# Patient Record
Sex: Male | Born: 1995 | Race: White | Hispanic: No | Marital: Married | Smoking: Never smoker
Health system: Southern US, Community
[De-identification: ages and names within clinical notes are randomized; demographics above are authoritative.]

## PROBLEM LIST (undated history)

## (undated) DIAGNOSIS — R519 Headache, unspecified: Secondary | ICD-10-CM

## (undated) DIAGNOSIS — K219 Gastro-esophageal reflux disease without esophagitis: Secondary | ICD-10-CM

## (undated) HISTORY — PX: ANKLE SURGERY: SHX546

## (undated) HISTORY — DX: Headache, unspecified: R51.9

## (undated) HISTORY — PX: FRACTURE SURGERY: SHX138

## (undated) HISTORY — DX: Gastro-esophageal reflux disease without esophagitis: K21.9

## (undated) HISTORY — PX: WRIST SURGERY: SHX841

---
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
## 2017-05-07 ENCOUNTER — Other Ambulatory Visit: Payer: Self-pay

## 2017-05-07 ENCOUNTER — Ambulatory Visit
Admission: EM | Admit: 2017-05-07 | Discharge: 2017-05-07 | Disposition: A | Discharge status: Home / Self Care | Payer: BLUE CROSS/BLUE SHIELD | Attending: Family Medicine | Admitting: Family Medicine

## 2017-05-07 DIAGNOSIS — L237 Allergic contact dermatitis due to plants, except food: Secondary | ICD-10-CM

## 2017-05-07 MED ORDER — PREDNISONE 20 MG PO TABS: ORAL_TABLET | ORAL | 0 refills | Status: DC

## 2017-05-07 NOTE — ED Provider Notes (Signed)
[facility]    CSN: [id number] Arrival date & time: 05/07/17  1350     History   Chief Complaint Chief Complaint  Patient presents with  . Poison Ivy    HPI Theodore Greer is a 22 y.o. male.   22 yo male with a 2 day h/o itchy rash to arms and hands after exposure to poison ivy earlier this week and denies wheezing, shortness of breath, chest pain.   The history is provided by the patient.  Poison Ivy     History reviewed. No pertinent past medical history.  There are no active problems to display for this patient.   Past Surgical History:  Procedure Laterality Date  . WRIST SURGERY         Home Medications    Prior to Admission medications   Medication Sig Start Date End Date Taking? Authorizing Provider  predniSONE (DELTASONE) 20 MG tablet 2 tabs po qd x 7 days 05/07/17   Payton Mccallumonty, Cara Aguino, MD    Family History History reviewed. No pertinent family history.  Social History Social History   Tobacco Use  . Smoking status: Never Smoker  . Smokeless tobacco: Never Used  Substance Use Topics  . Alcohol use: Not on file  . Drug use: Not on file     Allergies   Patient has no known allergies.   Review of Systems Review of Systems   Physical Exam Triage Vital Signs ED Triage Vitals  Enc Vitals Group     BP 05/07/17 1410 136/77     Pulse Rate 05/07/17 1410 98     Resp 05/07/17 1410 16     Temp 05/07/17 1410 98.4 F (36.9 C)     Temp Source 05/07/17 1410 Oral     SpO2 05/07/17 1410 100 %     Weight 05/07/17 1408 157 lb (71.2 kg)     Height 05/07/17 1408 5\' 8"  (1.727 m)     Head Circumference --      Peak Flow --      Pain Score 05/07/17 1408 0     Pain Loc --      Pain Edu? --      Excl. in GC? --    No data found.  Updated Vital Signs BP 136/77 (BP Location: Left Arm)   Pulse 98   Temp 98.4 F (36.9 C) (Oral)   Resp 16   Ht 5\' 8"  (1.727 m)   Wt 157 lb (71.2 kg)   SpO2 100%   BMI 23.87 kg/m   Visual Acuity Right  Eye Distance:   Left Eye Distance:   Bilateral Distance:    Right Eye Near:   Left Eye Near:    Bilateral Near:     Physical Exam  Constitutional: He appears well-developed and well-nourished. No distress.  Skin: Rash noted. Rash is vesicular. He is not diaphoretic. There is erythema.     Nursing note and vitals reviewed.    UC Treatments / Results  Labs (all labs ordered are listed, but only abnormal results are displayed) Labs Reviewed - No data to display  EKG None Radiology No results found.  Procedures Procedures (including critical care time)  Medications Ordered in UC Medications - No data to display   Initial Impression / Assessment and Plan / UC Course  I have reviewed the triage vital signs and the nursing notes.  Pertinent labs & imaging results that were available during my care of the patient were reviewed by me  and considered in my medical decision making (see chart for details).      Final Clinical Impressions(s) / UC Diagnoses   Final diagnoses:  Contact dermatitis due to poison ivy    ED Discharge Orders        Ordered    predniSONE (DELTASONE) 20 MG tablet     05/07/17 1438     1. diagnosis reviewed with patient 2. rx as per orders above; reviewed possible side effects, interactions, risks and benefits  3. Recommend supportive treatment with  4. Follow-up prn if symptoms worsen or don't improve   Controlled Substance Prescriptions [place] Controlled Substance Registry consulted? Not Applicable   Payton Mccallum, MD 05/07/17 [phone]

## 2017-05-07 NOTE — ED Triage Notes (Signed)
Pt reports he got into poison ivy. Mostly on arms and face

## 2017-09-04 ENCOUNTER — Ambulatory Visit
Admission: EM | Admit: 2017-09-04 | Discharge: 2017-09-04 | Disposition: A | Discharge status: Home / Self Care | Payer: Worker's Compensation

## 2019-12-10 ENCOUNTER — Emergency Department
Admission: EM | Admit: 2019-12-10 | Discharge: 2019-12-10 | Disposition: A | Discharge status: Home / Self Care | Payer: BC Managed Care – PPO | Attending: Emergency Medicine | Admitting: Emergency Medicine

## 2019-12-10 ENCOUNTER — Other Ambulatory Visit: Payer: Self-pay

## 2019-12-10 ENCOUNTER — Emergency Department: Payer: BC Managed Care – PPO

## 2019-12-10 DIAGNOSIS — R079 Chest pain, unspecified: Secondary | ICD-10-CM | POA: Diagnosis present

## 2019-12-10 DIAGNOSIS — M940 Chondrocostal junction syndrome [Tietze]: Secondary | ICD-10-CM | POA: Diagnosis not present

## 2019-12-10 DIAGNOSIS — F419 Anxiety disorder, unspecified: Secondary | ICD-10-CM | POA: Insufficient documentation

## 2019-12-10 DIAGNOSIS — R000 Tachycardia, unspecified: Secondary | ICD-10-CM | POA: Insufficient documentation

## 2019-12-10 LAB — CBC WITH DIFFERENTIAL/PLATELET
Abs Immature Granulocytes: 0.04 10*3/uL (ref 0.00–0.07)
Basophils Absolute: 0.1 10*3/uL (ref 0.0–0.1)
Basophils Relative: 1 %
Eosinophils Absolute: 0.2 10*3/uL (ref 0.0–0.5)
Eosinophils Relative: 2 %
HCT: 44.5 % (ref 39.0–52.0)
Hemoglobin: 15.7 g/dL (ref 13.0–17.0)
Immature Granulocytes: 0 %
Lymphocytes Relative: 40 %
Lymphs Abs: 4.3 10*3/uL — ABNORMAL HIGH (ref 0.7–4.0)
MCH: 30.7 pg (ref 26.0–34.0)
MCHC: 35.3 g/dL (ref 30.0–36.0)
MCV: 86.9 fL (ref 80.0–100.0)
Monocytes Absolute: 0.7 10*3/uL (ref 0.1–1.0)
Monocytes Relative: 6 %
Neutro Abs: 5.4 10*3/uL (ref 1.7–7.7)
Neutrophils Relative %: 51 %
Platelets: 389 10*3/uL (ref 150–400)
RBC: 5.12 MIL/uL (ref 4.22–5.81)
RDW: 11.7 % (ref 11.5–15.5)
WBC: 10.7 10*3/uL — ABNORMAL HIGH (ref 4.0–10.5)
nRBC: 0 % (ref 0.0–0.2)

## 2019-12-10 LAB — COMPREHENSIVE METABOLIC PANEL
ALT: 21 U/L (ref 0–44)
AST: 23 U/L (ref 15–41)
Albumin: 5.1 g/dL — ABNORMAL HIGH (ref 3.5–5.0)
Alkaline Phosphatase: 67 U/L (ref 38–126)
Anion gap: 11 (ref 5–15)
BUN: 18 mg/dL (ref 6–20)
CO2: 23 mmol/L (ref 22–32)
Calcium: 9.4 mg/dL (ref 8.9–10.3)
Chloride: 105 mmol/L (ref 98–111)
Creatinine, Ser: 0.99 mg/dL (ref 0.61–1.24)
GFR, Estimated: >60 mL/min (ref >60)
Glucose, Bld: 117 mg/dL — ABNORMAL HIGH (ref 70–99)
Potassium: 3.7 mmol/L (ref 3.5–5.1)
Sodium: 139 mmol/L (ref 135–145)
Total Bilirubin: 0.9 mg/dL (ref 0.3–1.2)
Total Protein: 8.1 g/dL (ref 6.5–8.1)

## 2019-12-10 LAB — T4, FREE: Free T4: 0.82 ng/dL (ref 0.61–1.12)

## 2019-12-10 LAB — TSH: TSH: 1.759 u[IU]/mL (ref 0.350–4.500)

## 2019-12-10 LAB — TROPONIN I (HIGH SENSITIVITY): Troponin I (High Sensitivity): 3 ng/L (ref <18)

## 2019-12-10 LAB — FIBRIN DERIVATIVES D-DIMER (ARMC ONLY): Fibrin derivatives D-dimer (ARMC): 119.41 ng/mL (FEU) (ref 0.00–499.00)

## 2019-12-10 MED ORDER — KETOROLAC TROMETHAMINE 30 MG/ML IJ SOLN
10.0000 mg | Freq: Once | INTRAMUSCULAR | Status: AC
  Administered 2019-12-10: 9.9 mg via INTRAVENOUS
  Filled 2019-12-10: qty 1

## 2019-12-10 MED ORDER — SODIUM CHLORIDE 0.9 % IV BOLUS
1000.0000 mL | Freq: Once | INTRAVENOUS | Status: AC
  Administered 2019-12-10: 1000 mL via INTRAVENOUS

## 2019-12-10 MED ORDER — HYDROCODONE-ACETAMINOPHEN 5-325 MG PO TABS: 1.0000 | ORAL_TABLET | Freq: Four times a day (QID) | ORAL | 0 refills | Status: DC | PRN

## 2019-12-10 MED ORDER — IBUPROFEN 800 MG PO TABS: 800.0000 mg | ORAL_TABLET | Freq: Three times a day (TID) | ORAL | 0 refills | Status: DC | PRN

## 2019-12-10 NOTE — Discharge Instructions (Addendum)
1.  You may take pain medicines as needed (Motrin/Norco #15). 2.  Apply moist heat to affected area several times daily. 3.  Return to the ER for worsening symptoms, persistent vomiting, difficulty breathing or other concerns. 

## 2019-12-10 NOTE — ED Triage Notes (Signed)
Patient reports chest pain since 5 pm.  Worse with movement.

## 2019-12-10 NOTE — ED Provider Notes (Signed)
[facility] Emergency Department Provider Note   ____________________________________________   First MD Initiated Contact with Patient 12/10/19 0047     (approximate)  I have reviewed the triage vital signs and the nursing notes.   HISTORY  Chief Complaint Chest Pain    HPI Theodore Greer is a 24 y.o. male who presents to the ED from home with a chief complaint of chest pain.  Patient was at a paint and sip party (did not drink EtOH) when he noted sharp pain to his center/left chest on movement.  Symptoms not associated with diaphoresis, palpitations, nausea/vomiting or dizziness.  Denies recent fever, cough, abdominal pain, diarrhea.  Denies recent travel, trauma or hormone use.     Past medical history None   There are no problems to display for this patient.   Past Surgical History:  Procedure Laterality Date  . WRIST SURGERY      Prior to Admission medications   Medication Sig Start Date End Date Taking? Authorizing Provider  HYDROcodone-acetaminophen (NORCO) 5-325 MG tablet Take 1 tablet by mouth every 6 (six) hours as needed for moderate pain. 12/10/19   Irean Hong, MD  ibuprofen (ADVIL) 800 MG tablet Take 1 tablet (800 mg total) by mouth every 8 (eight) hours as needed for moderate pain. 12/10/19   Irean Hong, MD  predniSONE (DELTASONE) 20 MG tablet 2 tabs po qd x 7 days 05/07/17   Payton Mccallum, MD    Allergies Patient has no known allergies.  No family history on file.  Social History Social History   Tobacco Use  . Smoking status: Never Smoker  . Smokeless tobacco: Never Used  Substance Use Topics  . Alcohol use: Not on file  . Drug use: Not on file    Review of Systems  Constitutional: No fever/chills Eyes: No visual changes. ENT: No sore throat. Cardiovascular: Positive for chest pain. Respiratory: Denies shortness of breath. Gastrointestinal: No abdominal pain.  No nausea, no vomiting.  No diarrhea.  No  constipation. Genitourinary: Negative for dysuria. Musculoskeletal: Negative for back pain. Skin: Negative for rash. Neurological: Negative for headaches, focal weakness or numbness.   ____________________________________________   PHYSICAL EXAM:  VITAL SIGNS: ED Triage Vitals  Enc Vitals Group     BP 12/10/19 0044 (!) 154/95     Pulse Rate 12/10/19 0044 (!) 110     Resp 12/10/19 0044 17     Temp 12/10/19 0044 98.9 F (37.2 C)     Temp Source 12/10/19 0044 Oral     SpO2 12/10/19 0044 100 %     Weight 12/10/19 0042 185 lb (83.9 kg)     Height 12/10/19 0042 5\' 8"  (1.727 m)     Head Circumference --      Peak Flow --      Pain Score 12/10/19 0042 3     Pain Loc --      Pain Edu? --      Excl. in GC? --     Constitutional: Alert and oriented. Well appearing and in no acute distress. Eyes: Conjunctivae are normal. PERRL. EOMI. Head: Atraumatic. Nose: No congestion/rhinnorhea. Mouth/Throat: Mucous membranes are moist.   Neck: No stridor.  No thyromegaly. Cardiovascular: Tachycardic rate, regular rhythm. Grossly normal heart sounds.  Good peripheral circulation. Respiratory: Normal respiratory effort.  No retractions. Lungs CTAB.  Reproducible pain on movement of trunk. Gastrointestinal: Soft and nontender. No distention. No abdominal bruits. No CVA tenderness. Musculoskeletal: No lower extremity tenderness nor edema.  No joint effusions. Neurologic:  Normal speech and language. No gross focal neurologic deficits are appreciated. No gait instability. Skin:  Skin is warm, dry and intact. No rash noted. Psychiatric: Mood and affect are mildly anxious. Speech and behavior are normal.  ____________________________________________   LABS (all labs ordered are listed, but only abnormal results are displayed)  Labs Reviewed  CBC WITH DIFFERENTIAL/PLATELET - Abnormal; Notable for the following components:      Result Value   WBC 10.7 (*)    Lymphs Abs 4.3 (*)    All other  components within normal limits  COMPREHENSIVE METABOLIC PANEL - Abnormal; Notable for the following components:   Glucose, Bld 117 (*)    Albumin 5.1 (*)    All other components within normal limits  FIBRIN DERIVATIVES D-DIMER ([facility] ONLY)  TSH  T4, FREE  TROPONIN I (HIGH SENSITIVITY)   ____________________________________________  EKG  ED ECG REPORT I, Elnita Surprenant J, the attending physician, personally viewed and interpreted this ECG.   Date: 12/10/2019  EKG Time: 0046  Rate: 121  Rhythm: sinus tachycardia  Axis: Normal  Intervals:none  ST&T Change: Nonspecific  ____________________________________________  RADIOLOGY I, Johanna Matto J, personally viewed and evaluated these images (plain radiographs) as part of my medical decision making, as well as reviewing the written report by the radiologist.  ED MD interpretation: No acute cardiopulmonary process  Official radiology report(s): DG Chest 2 View  Result Date: 12/10/2019 CLINICAL DATA:  Chest pain, worse with movement EXAM: CHEST - 2 VIEW COMPARISON:  None FINDINGS: No consolidation, features of edema, pneumothorax, or effusion. Pulmonary vascularity is normally distributed. The cardiomediastinal contours are unremarkable. No acute osseous or soft tissue abnormality. IMPRESSION: No acute cardiopulmonary abnormality. Electronically Signed   By: Kreg Shropshire M.D.   On: 12/10/2019 01:21    ____________________________________________   PROCEDURES  Procedure(s) performed (including Critical Care):  .1-3 Lead EKG Interpretation Performed by: Irean Hong, MD Authorized by: Irean Hong, MD     Interpretation: abnormal     ECG rate:  121   ECG rate assessment: tachycardic     Rhythm: sinus tachycardia     Ectopy: none     Conduction: normal   Comments:     Patient placed on cardiac monitor to evaluate for arrhythmias     ____________________________________________   INITIAL IMPRESSION / ASSESSMENT AND PLAN /  ED COURSE  As part of my medical decision making, I reviewed the following data within the electronic MEDICAL RECORD NUMBER Nursing notes reviewed and incorporated, Labs reviewed, EKG interpreted, Old chart reviewed, Radiograph reviewed and Notes from prior ED visits     24 year old male who presents with chest pain. Differential diagnosis includes, but is not limited to, ACS, aortic dissection, pulmonary embolism, cardiac tamponade, pneumothorax, pneumonia, pericarditis, myocarditis, GI-related causes including esophagitis/gastritis, and musculoskeletal chest wall pain.    Will obtain cardiac panel, chest x-ray.  Patient attributing his tachycardia to anxiety/nervousness.  Denies EtOH or drug use.  Drinks 1 soda daily, no energy drinks or herbal supplements.  Will administer IV fluids, Toradol and reassess.  Clinical Course as of Dec 09 198  Wynelle Link Dec 10, 2019  0157 Patient feeling significantly better; heart rate 83.  Updated patient on all test results.  Do not feel repeat troponin is warranted given that troponin is negative after >8 hours of chest pain onset.  Strict return precautions given.  Patient verbalizes understanding agrees with plan of care.   [JS]    Clinical Course User  Index [JS] Irean Hong, MD     ____________________________________________   FINAL CLINICAL IMPRESSION(S) / ED DIAGNOSES  Final diagnoses:  Nonspecific chest pain  Costochondritis     ED Discharge Orders         Ordered    ibuprofen (ADVIL) 800 MG tablet  Every 8 hours PRN        12/10/19 0159    HYDROcodone-acetaminophen (NORCO) 5-325 MG tablet  Every 6 hours PRN        12/10/19 0159          *Please note:  Theodore Greer was evaluated in Emergency Department on 12/10/2019 for the symptoms described in the history of present illness. He was evaluated in the context of the global COVID-19 pandemic, which necessitated consideration that the patient might be at risk for infection with the  SARS-CoV-2 virus that causes COVID-19. Institutional protocols and algorithms that pertain to the evaluation of patients at risk for COVID-19 are in a state of rapid change based on information released by regulatory bodies including the CDC and federal and state organizations. These policies and algorithms were followed during the patient's care in the ED.  Some ED evaluations and interventions may be delayed as a result of limited staffing during and the pandemic.*   Note:  This document was prepared using Dragon voice recognition software and may include unintentional dictation errors.   Irean Hong, MD 12/10/19 [phone]

## 2020-10-25 DIAGNOSIS — R000 Tachycardia, unspecified: Secondary | ICD-10-CM | POA: Diagnosis not present

## 2020-10-25 DIAGNOSIS — Z6828 Body mass index (BMI) 28.0-28.9, adult: Secondary | ICD-10-CM | POA: Diagnosis not present

## 2020-10-25 DIAGNOSIS — Z5329 Procedure and treatment not carried out because of patient's decision for other reasons: Secondary | ICD-10-CM | POA: Diagnosis not present

## 2020-10-25 DIAGNOSIS — R1013 Epigastric pain: Secondary | ICD-10-CM | POA: Diagnosis not present

## 2020-10-27 DIAGNOSIS — R000 Tachycardia, unspecified: Secondary | ICD-10-CM | POA: Diagnosis not present

## 2021-04-22 DIAGNOSIS — M25572 Pain in left ankle and joints of left foot: Secondary | ICD-10-CM | POA: Diagnosis not present

## 2021-04-22 DIAGNOSIS — X501XXA Overexertion from prolonged static or awkward postures, initial encounter: Secondary | ICD-10-CM | POA: Diagnosis not present

## 2021-04-22 DIAGNOSIS — Y9364 Activity, baseball: Secondary | ICD-10-CM | POA: Diagnosis not present

## 2021-04-22 DIAGNOSIS — S82899A Other fracture of unspecified lower leg, initial encounter for closed fracture: Secondary | ICD-10-CM | POA: Diagnosis not present

## 2021-04-22 DIAGNOSIS — S92112A Displaced fracture of neck of left talus, initial encounter for closed fracture: Secondary | ICD-10-CM | POA: Diagnosis not present

## 2021-04-22 DIAGNOSIS — S92142A Displaced dome fracture of left talus, initial encounter for closed fracture: Secondary | ICD-10-CM | POA: Diagnosis not present

## 2021-04-22 DIAGNOSIS — R509 Fever, unspecified: Secondary | ICD-10-CM | POA: Diagnosis not present

## 2021-04-22 DIAGNOSIS — S92122A Displaced fracture of body of left talus, initial encounter for closed fracture: Secondary | ICD-10-CM | POA: Diagnosis not present

## 2021-04-22 DIAGNOSIS — S92102A Unspecified fracture of left talus, initial encounter for closed fracture: Secondary | ICD-10-CM | POA: Diagnosis not present

## 2021-04-23 DIAGNOSIS — S92142A Displaced dome fracture of left talus, initial encounter for closed fracture: Secondary | ICD-10-CM | POA: Diagnosis not present

## 2021-04-23 DIAGNOSIS — S92102A Unspecified fracture of left talus, initial encounter for closed fracture: Secondary | ICD-10-CM | POA: Diagnosis not present

## 2021-04-23 DIAGNOSIS — S92122A Displaced fracture of body of left talus, initial encounter for closed fracture: Secondary | ICD-10-CM | POA: Diagnosis not present

## 2021-04-24 DIAGNOSIS — S92112A Displaced fracture of neck of left talus, initial encounter for closed fracture: Secondary | ICD-10-CM | POA: Insufficient documentation

## 2021-04-30 DIAGNOSIS — F909 Attention-deficit hyperactivity disorder, unspecified type: Secondary | ICD-10-CM | POA: Diagnosis not present

## 2021-04-30 DIAGNOSIS — Z6830 Body mass index (BMI) 30.0-30.9, adult: Secondary | ICD-10-CM | POA: Diagnosis not present

## 2021-04-30 DIAGNOSIS — M24072 Loose body in left ankle: Secondary | ICD-10-CM | POA: Diagnosis not present

## 2021-04-30 DIAGNOSIS — Y9364 Activity, baseball: Secondary | ICD-10-CM | POA: Diagnosis not present

## 2021-04-30 DIAGNOSIS — S92112A Displaced fracture of neck of left talus, initial encounter for closed fracture: Secondary | ICD-10-CM | POA: Diagnosis not present

## 2021-04-30 DIAGNOSIS — E669 Obesity, unspecified: Secondary | ICD-10-CM | POA: Diagnosis not present

## 2021-04-30 DIAGNOSIS — Z79899 Other long term (current) drug therapy: Secondary | ICD-10-CM | POA: Diagnosis not present

## 2021-04-30 DIAGNOSIS — G8918 Other acute postprocedural pain: Secondary | ICD-10-CM | POA: Diagnosis not present

## 2021-04-30 DIAGNOSIS — W19XXXA Unspecified fall, initial encounter: Secondary | ICD-10-CM | POA: Diagnosis not present

## 2021-04-30 DIAGNOSIS — M25572 Pain in left ankle and joints of left foot: Secondary | ICD-10-CM | POA: Diagnosis not present

## 2021-05-12 DIAGNOSIS — S92102D Unspecified fracture of left talus, subsequent encounter for fracture with routine healing: Secondary | ICD-10-CM | POA: Diagnosis not present

## 2021-06-02 DIAGNOSIS — S92102A Unspecified fracture of left talus, initial encounter for closed fracture: Secondary | ICD-10-CM | POA: Diagnosis not present

## 2021-07-14 DIAGNOSIS — S92102D Unspecified fracture of left talus, subsequent encounter for fracture with routine healing: Secondary | ICD-10-CM | POA: Diagnosis not present

## 2021-09-09 DIAGNOSIS — S92102D Unspecified fracture of left talus, subsequent encounter for fracture with routine healing: Secondary | ICD-10-CM | POA: Diagnosis not present

## 2021-09-09 DIAGNOSIS — S92102A Unspecified fracture of left talus, initial encounter for closed fracture: Secondary | ICD-10-CM | POA: Diagnosis not present

## 2021-09-16 ENCOUNTER — Encounter: Payer: Self-pay | Admitting: Family

## 2021-09-16 NOTE — Telephone Encounter (Signed)
Can we get pt in for 40 minute slot next availability for a double booking? Can be as early as this week.

## 2021-10-14 ENCOUNTER — Other Ambulatory Visit: Payer: Self-pay | Admitting: Family

## 2021-10-14 ENCOUNTER — Encounter: Payer: Self-pay | Admitting: Family

## 2021-10-14 ENCOUNTER — Ambulatory Visit (INDEPENDENT_AMBULATORY_CARE_PROVIDER_SITE_OTHER): Payer: BC Managed Care – PPO | Admitting: Family

## 2021-10-14 VITALS — BP 110/72 | HR 97 | Temp 97.9°F | Resp 16 | Ht 67.25 in | Wt 206.4 lb

## 2021-10-14 DIAGNOSIS — K921 Melena: Secondary | ICD-10-CM | POA: Diagnosis not present

## 2021-10-14 DIAGNOSIS — Z23 Encounter for immunization: Secondary | ICD-10-CM

## 2021-10-14 DIAGNOSIS — R12 Heartburn: Secondary | ICD-10-CM | POA: Diagnosis not present

## 2021-10-14 DIAGNOSIS — G44209 Tension-type headache, unspecified, not intractable: Secondary | ICD-10-CM | POA: Diagnosis not present

## 2021-10-14 DIAGNOSIS — F902 Attention-deficit hyperactivity disorder, combined type: Secondary | ICD-10-CM | POA: Diagnosis not present

## 2021-10-14 DIAGNOSIS — Z13220 Encounter for screening for lipoid disorders: Secondary | ICD-10-CM | POA: Diagnosis not present

## 2021-10-14 DIAGNOSIS — R10821 Right upper quadrant rebound abdominal tenderness: Secondary | ICD-10-CM

## 2021-10-14 DIAGNOSIS — R0789 Other chest pain: Secondary | ICD-10-CM

## 2021-10-14 MED ORDER — OMEPRAZOLE 20 MG PO CPDR: 20.0000 mg | DELAYED_RELEASE_CAPSULE | Freq: Every day | ORAL | 3 refills | Status: DC

## 2021-10-14 NOTE — Progress Notes (Signed)
New Patient Office Visit  Subjective:  Patient ID: Theodore Greer, male    DOB: [birth year]  Age: 26 y.o. MRN: [id number]  CC:  Chief Complaint  Patient presents with   Establish Care   Gastroesophageal Reflux   Headache    Eye doctor said it was coming from needing glasses but they have not came in yet.    HPI Vue Pavon is here to establish care as a new patient.  Prior provider was: since pediatrics  Pt is without acute concerns.   Has been having worsening headaches, steady, and went and eye doctor and was dx with astigmatism and eye doctor stated might be due to this. Eyes are straining and slightly with frontal headaches. Denies any allergies.  Taking tylenol ibuprofen prn.  Drinks good amount of water daily.   Also with acid reflux. He will feel acid indigestion, and some pains or aches around his generalized chest area. Notices it after eating certain foods such as greasy fried fatty foods. Has been over the last two years. Burps at times but not overly burping. No nausea. Doesn't notice any specific concern with red meats.   Occasional blood at times in the stool does have constipation.   chronic concerns:  ADHD as a child: controlled now without medication.  Was on concerta as a child and ritalin, no longer. Able to manage now without medication.   Past Medical History:  Diagnosis Date   Acid reflux    Headache     Past Surgical History:  Procedure Laterality Date   ANKLE SURGERY Left    WRIST SURGERY Left    bone spur growth plate removed    Family History  Problem Relation Age of Onset   Healthy Mother    Healthy Father    Brain cancer Maternal Grandfather     Social History   Socioeconomic History   Marital status: Married    Spouse name: Not on file   Number of children: 0   Years of education: Not on file   Highest education level: Not on file  Occupational History    Comment: [employer]  Tobacco Use   Smoking status: Never    Smokeless tobacco: Former  Scientific laboratory technician Use: Former  Substance and Sexual Activity   Alcohol use: Yes    Comment: occ   Drug use: Not Currently    Types: Marijuana   Sexual activity: Yes    Partners: Female    Birth control/protection: None    Comment: wife is pregnant  Other Topics Concern   Not on file  Social History Narrative   Wife pregnant due in feb   Social Determinants of Health   Financial Resource Strain: Not on file  Food Insecurity: Not on file  Transportation Needs: Not on file  Physical Activity: Not on file  Stress: Not on file  Social Connections: Not on file  Intimate Partner Violence: Not on file    Outpatient Medications Prior to Visit  Medication Sig Dispense Refill   HYDROcodone-acetaminophen (NORCO) 5-325 MG tablet Take 1 tablet by mouth every 6 (six) hours as needed for moderate pain. (Patient not taking: Reported on 10/14/2021) 15 tablet 0   ibuprofen (ADVIL) 800 MG tablet Take 1 tablet (800 mg total) by mouth every 8 (eight) hours as needed for moderate pain. (Patient not taking: Reported on 10/14/2021) 15 tablet 0   predniSONE (DELTASONE) 20 MG tablet 2 tabs po qd x 7 days (Patient not  taking: Reported on 10/14/2021) 14 tablet 0   No facility-administered medications prior to visit.    No Known Allergies  ROS Review of Systems      Objective:    Physical Exam Constitutional:      General: He is not in acute distress.    Appearance: Normal appearance. He is obese. He is not ill-appearing, toxic-appearing or diaphoretic.  HENT:     Head: Normocephalic.     Right Ear: Tympanic membrane normal.     Left Ear: Tympanic membrane normal.     Nose: Nose normal.  Eyes:     Pupils: Pupils are equal, round, and reactive to light.  Cardiovascular:     Rate and Rhythm: Normal rate and regular rhythm.  Pulmonary:     Effort: Pulmonary effort is normal.     Breath sounds: Normal breath sounds.  Abdominal:     General: Abdomen is flat.  Bowel sounds are normal.     Palpations: Abdomen is soft.     Tenderness: There is abdominal tenderness in the right upper quadrant and epigastric area. Negative signs include Murphy's sign, McBurney's sign, psoas sign and obturator sign.  Musculoskeletal:        General: Normal range of motion.     Cervical back: Normal range of motion.  Skin:    General: Skin is warm.  Neurological:     General: No focal deficit present.     Mental Status: He is alert and oriented to person, place, and time.     Motor: No weakness.     Gait: Gait normal.  Psychiatric:        Mood and Affect: Mood normal.        Behavior: Behavior normal.        Thought Content: Thought content normal.        Judgment: Judgment normal.     Gen: NAD, resting comfortably CV: RRR with no murmurs appreciated Pulm: NWOB, CTAB with no crackles, wheezes, or rhonchi Skin: warm, dry Psych: Normal affect and thought content  BP 110/72   Pulse 97   Temp 97.9 F (36.6 C)   Resp 16   Ht 5' 7.25" (1.708 m)   Wt 206 lb 6 oz (93.6 kg)   SpO2 98%   BMI 32.08 kg/m  Wt Readings from Last 3 Encounters:  10/14/21 206 lb 6 oz (93.6 kg)  12/10/19 185 lb (83.9 kg)  05/07/17 157 lb (71.2 kg) (53 %, Z= 0.07)*   * Growth percentiles are based on CDC (Boys, 2-20 Years) data.     Health Maintenance Due  Topic Date Due   HPV VACCINES (1 - Male 2-dose series) Never done   HIV Screening  Never done   Hepatitis C Screening  Never done   INFLUENZA VACCINE  Never done       Topic Date Due   HPV VACCINES (1 - Male 2-dose series) Never done    Lab Results  Component Value Date   TSH 0.73 10/14/2021   Lab Results  Component Value Date   WBC 7.5 10/14/2021   HGB 15.1 10/14/2021   HCT 44.1 10/14/2021   MCV 89.6 10/14/2021   PLT 388.0 10/14/2021   Lab Results  Component Value Date   NA 137 10/14/2021   K 4.0 10/14/2021   CO2 26 10/14/2021   GLUCOSE 89 10/14/2021   BUN 13 10/14/2021   CREATININE 0.94 10/14/2021    BILITOT 0.4 10/14/2021   ALKPHOS 67 10/14/2021  AST 112 (H) 10/14/2021   ALT 119 (H) 10/14/2021   PROT 7.3 10/14/2021   ALBUMIN 4.7 10/14/2021   CALCIUM 9.5 10/14/2021   ANIONGAP 11 12/10/2019   GFR 113.68 10/14/2021   Lab Results  Component Value Date   CHOL 159 10/14/2021   Lab Results  Component Value Date   HDL 46.90 10/14/2021   Lab Results  Component Value Date   LDLCALC 99 10/14/2021   Lab Results  Component Value Date   TRIG 65.0 10/14/2021   Lab Results  Component Value Date   CHOLHDL 3 10/14/2021   No results found for: "HGBA1C"    Assessment & Plan:   Problem List Items Addressed This Visit       Other   Attention deficit hyperactivity disorder (ADHD), combined type - Primary    Controlled currently without medication.       Tension-type headache, not intractable    Pt advised of following MIGRAINE RECOMMENDATIONS TO CONSIDER  1. Limit use of pain relievers to no more than 2 days out of the week.  These medications include acetaminophen, NSAIDs (ibuprofen/Advil/Motrin, naproxen/Aleve, triptans (Imitrex/sumatriptan), Excedrin, and narcotics.  This will help reduce risk of rebound headaches. 2. Be aware of common food triggers:             - Caffeine:  coffee, black tea, cola, Mt. Dew             - Chocolate             - Dairy:  aged cheeses (brie, blue, cheddar, gouda, Craig, provolone, Corinne, Swiss, etc), chocolate milk, buttermilk, sour cream, limit eggs and yogurt             - Nuts, peanut butter             - Alcohol             - Cereals/grains:  FRESH breads (fresh bagels, sourdough, doughnuts), yeast productions             - Processed/canned/aged/cured meats (pre-packaged deli meats, hotdogs)             - MSG/glutamate:  soy sauce, flavor enhancer, pickled/preserved/marinated foods             - Sweeteners:  aspartame (Equal, Nutrasweet).  Sugar and Splenda are okay             - Vegetables:  legumes (lima beans, lentils, snow  peas, fava beans, pinto peans, peas, garbanzo beans), sauerkraut, onions, olives, pickles             - Fruit:  avocados, bananas, citrus fruit (orange, lemon, grapefruit), mango             - Other:  Frozen meals, macaroni and cheese 7. Routine exercise 8. Stay adequately hydrated (aim for 64 oz water daily) 9. Keep headache diary 10. Maintain proper stress management 11. Maintain proper sleep hygiene 12. Do not skip meals 13. Consider supplements:  magnesium citrate 400mg  daily, riboflavin 400mg  daily, coenzyme Q10 100mg  three times daily.        Relevant Orders   TSH (Completed)   Sedimentation rate (Completed)   Blood in stool    Fob ordered pending results ordering cbc as well       Relevant Orders   Fecal occult blood, imunochemical   Heartburn    Trial omeprazole 20 mg  Try to decrease and or avoid spicy foods, fried fatty foods, and also caffeine and chocolate  as these can increase heartburn symptoms.        Relevant Orders   EKG 12-Lead (Completed)   Right upper quadrant abdominal tenderness with rebound tenderness    Ordering lab work pending results Consider u/s if trial ppi and lab work unrevealing      Chest discomfort    ekg in office nsr       Relevant Orders   EKG 12-Lead (Completed)   Comprehensive metabolic panel (Completed)   CBC with Differential/Platelet (Completed)   TSH (Completed)   Other Visit Diagnoses     Screening for lipoid disorders       Relevant Orders   Lipid panel (Completed)   Need for Tdap vaccination       Relevant Orders   Tdap vaccine greater than or equal to 7yo IM (Completed)       Meds ordered this encounter  Medications   DISCONTD: omeprazole (PRILOSEC) 20 MG capsule    Sig: Take 1 capsule (20 mg total) by mouth daily.    Dispense:  30 capsule    Refill:  3    Order Specific Question:   Supervising Provider    Answer:   Ermalene Searing, AMY E [[id number]]    Follow-up: Return in about 1 month (around 11/13/2021) for f/u  acid reflux.    Mort Sawyers, FNP

## 2021-10-14 NOTE — Patient Instructions (Addendum)
Welcome to our clinic, I am happy to have you as my new patient. I am excited to continue on this healthcare journey with you.  Try to decrease and or avoid spicy foods, fried fatty foods, and also caffeine and chocolate as these can increase heartburn symptoms.   Stop by the lab prior to leaving today. I will notify you of your results once received.   Please keep in mind Any my chart messages you send have up to a three business day turnaround for a response.  Phone calls may take up to a one full business day turnaround for a  response.   If you need a medication refill I recommend you request it through the pharmacy as this is easiest for [place] rather than sending a message and or phone call.   Due to recent changes in healthcare laws, you may see results of your imaging and/or laboratory studies on MyChart before I have had a chance to review them.  I understand that in some cases there may be results that are confusing or concerning to you. Please understand that not all results are received at the same time and often I may need to interpret multiple results in order to provide you with the best plan of care or course of treatment. Therefore, I ask that you please give me 2 business days to thoroughly review all your results before contacting my office for clarification. Should we see a critical lab result, you will be contacted sooner.   It was a pleasure seeing you today! Please do not hesitate to reach out with any questions and or concerns.  Regards,   Eugenia Pancoast FNP-C

## 2021-10-15 ENCOUNTER — Other Ambulatory Visit: Payer: Self-pay | Admitting: Family

## 2021-10-15 DIAGNOSIS — R7989 Other specified abnormal findings of blood chemistry: Secondary | ICD-10-CM

## 2021-10-15 DIAGNOSIS — K921 Melena: Secondary | ICD-10-CM

## 2021-10-15 DIAGNOSIS — R10821 Right upper quadrant rebound abdominal tenderness: Secondary | ICD-10-CM

## 2021-10-15 DIAGNOSIS — R0789 Other chest pain: Secondary | ICD-10-CM

## 2021-10-15 DIAGNOSIS — R12 Heartburn: Secondary | ICD-10-CM

## 2021-10-15 LAB — CBC WITH DIFFERENTIAL/PLATELET
Basophils Absolute: 0.1 10*3/uL (ref 0.0–0.1)
Basophils Relative: 0.8 % (ref 0.0–3.0)
Eosinophils Absolute: 0.1 10*3/uL (ref 0.0–0.7)
Eosinophils Relative: 2 % (ref 0.0–5.0)
HCT: 44.1 % (ref 39.0–52.0)
Hemoglobin: 15.1 g/dL (ref 13.0–17.0)
Lymphocytes Relative: 31.5 % (ref 12.0–46.0)
Lymphs Abs: 2.4 10*3/uL (ref 0.7–4.0)
MCHC: 34.3 g/dL (ref 30.0–36.0)
MCV: 89.6 fl (ref 78.0–100.0)
Monocytes Absolute: 0.5 10*3/uL (ref 0.1–1.0)
Monocytes Relative: 6.3 % (ref 3.0–12.0)
Neutro Abs: 4.4 10*3/uL (ref 1.4–7.7)
Neutrophils Relative %: 59.4 % (ref 43.0–77.0)
Platelets: 388 10*3/uL (ref 150.0–400.0)
RBC: 4.92 Mil/uL (ref 4.22–5.81)
RDW: 13 % (ref 11.5–15.5)
WBC: 7.5 10*3/uL (ref 4.0–10.5)

## 2021-10-15 LAB — LIPID PANEL
Cholesterol: 159 mg/dL (ref 0–200)
HDL: 46.9 mg/dL (ref >39.00)
LDL Cholesterol: 99 mg/dL (ref 0–99)
NonHDL: 111.99
Total CHOL/HDL Ratio: 3
Triglycerides: 65 mg/dL (ref 0.0–149.0)
VLDL: 13 mg/dL (ref 0.0–40.0)

## 2021-10-15 LAB — COMPREHENSIVE METABOLIC PANEL
ALT: 119 U/L — ABNORMAL HIGH (ref 0–53)
AST: 112 U/L — ABNORMAL HIGH (ref 0–37)
Albumin: 4.7 g/dL (ref 3.5–5.2)
Alkaline Phosphatase: 67 U/L (ref 39–117)
BUN: 13 mg/dL (ref 6–23)
CO2: 26 mEq/L (ref 19–32)
Calcium: 9.5 mg/dL (ref 8.4–10.5)
Chloride: 103 mEq/L (ref 96–112)
Creatinine, Ser: 0.94 mg/dL (ref 0.40–1.50)
GFR: 113.68 mL/min (ref >60.00)
Glucose, Bld: 89 mg/dL (ref 70–99)
Potassium: 4 mEq/L (ref 3.5–5.1)
Sodium: 137 mEq/L (ref 135–145)
Total Bilirubin: 0.4 mg/dL (ref 0.2–1.2)
Total Protein: 7.3 g/dL (ref 6.0–8.3)

## 2021-10-15 LAB — SEDIMENTATION RATE: Sed Rate: 2 mm/hr (ref 0–15)

## 2021-10-15 LAB — TSH: TSH: 0.73 u[IU]/mL (ref 0.35–5.50)

## 2021-10-15 NOTE — Progress Notes (Signed)
Upon reviewing your labs your liver function is pretty elevated.  Given your ongoing symptoms I will order going to order a stat ultrasound of your stomach to evaluate your liver and your gallbladder further.  I did put this in the stat.  You can call to make your own appointment I sent it to GI imaging here is the number.   [address], [place] Phone [phone],  8-430 pm   Labs otherwise look good

## 2021-10-16 ENCOUNTER — Ambulatory Visit
Admission: RE | Admit: 2021-10-16 | Discharge: 2021-10-16 | Disposition: A | Discharge status: Home / Self Care | Payer: BC Managed Care – PPO | Source: Ambulatory Visit | Attending: Family | Admitting: Family

## 2021-10-16 ENCOUNTER — Other Ambulatory Visit: Payer: Self-pay | Admitting: Family

## 2021-10-16 DIAGNOSIS — R7989 Other specified abnormal findings of blood chemistry: Secondary | ICD-10-CM

## 2021-10-16 DIAGNOSIS — R10821 Right upper quadrant rebound abdominal tenderness: Secondary | ICD-10-CM

## 2021-10-16 DIAGNOSIS — R0789 Other chest pain: Secondary | ICD-10-CM

## 2021-10-16 DIAGNOSIS — R109 Unspecified abdominal pain: Secondary | ICD-10-CM | POA: Diagnosis not present

## 2021-10-16 DIAGNOSIS — K921 Melena: Secondary | ICD-10-CM

## 2021-10-16 NOTE — Progress Notes (Signed)
There was fatty liver seen, work on diet and exercise Did any change pt have any recent etoh prior to lab work? Let's repeat liver labs in two weeks.  Is pt still experiencing any pain?

## 2021-10-19 DIAGNOSIS — K921 Melena: Secondary | ICD-10-CM | POA: Insufficient documentation

## 2021-10-19 DIAGNOSIS — R0789 Other chest pain: Secondary | ICD-10-CM | POA: Insufficient documentation

## 2021-10-19 DIAGNOSIS — G44209 Tension-type headache, unspecified, not intractable: Secondary | ICD-10-CM | POA: Insufficient documentation

## 2021-10-19 DIAGNOSIS — R10821 Right upper quadrant rebound abdominal tenderness: Secondary | ICD-10-CM | POA: Insufficient documentation

## 2021-10-19 DIAGNOSIS — R12 Heartburn: Secondary | ICD-10-CM | POA: Insufficient documentation

## 2021-10-19 NOTE — Assessment & Plan Note (Signed)
Fob ordered pending results ordering cbc as well

## 2021-10-19 NOTE — Assessment & Plan Note (Signed)
ekg in office nsr

## 2021-10-19 NOTE — Assessment & Plan Note (Signed)
Pt advised of following MIGRAINE RECOMMENDATIONS TO CONSIDER  1. Limit use of pain relievers to no more than 2 days out of the week.  These medications include acetaminophen, NSAIDs (ibuprofen/Advil/Motrin, naproxen/Aleve, triptans (Imitrex/sumatriptan), Excedrin, and narcotics.  This will help reduce risk of rebound headaches. 2. Be aware of common food triggers:             - Caffeine:  coffee, black tea, cola, Mt. Dew             - Chocolate             - Dairy:  aged cheeses (brie, blue, cheddar, gouda, [place], provolone, [place], Swiss, etc), chocolate milk, buttermilk, sour cream, limit eggs and yogurt             - Nuts, peanut butter             - Alcohol             - Cereals/grains:  FRESH breads (fresh bagels, sourdough, doughnuts), yeast productions             - Processed/canned/aged/cured meats (pre-packaged deli meats, hotdogs)             - MSG/glutamate:  soy sauce, flavor enhancer, pickled/preserved/marinated foods             - Sweeteners:  aspartame (Equal, Nutrasweet).  Sugar and Splenda are okay             - Vegetables:  legumes (lima beans, lentils, snow peas, fava beans, pinto peans, peas, garbanzo beans), sauerkraut, onions, olives, pickles             - Fruit:  avocados, bananas, citrus fruit (orange, lemon, grapefruit), mango             - Other:  Frozen meals, macaroni and cheese 7. Routine exercise 8. Stay adequately hydrated (aim for 64 oz water daily) 9. Keep headache diary 10. Maintain proper stress management 11. Maintain proper sleep hygiene 12. Do not skip meals 13. Consider supplements:  magnesium citrate 400mg  daily, riboflavin 400mg  daily, coenzyme Q10 100mg  three times daily.

## 2021-10-19 NOTE — Assessment & Plan Note (Signed)
Ordering lab work pending results Consider u/s if trial ppi and lab work unrevealing

## 2021-10-19 NOTE — Assessment & Plan Note (Signed)
Controlled currently without medication.

## 2021-10-19 NOTE — Assessment & Plan Note (Signed)
Trial omeprazole 20 mg  Try to decrease and or avoid spicy foods, fried fatty foods, and also caffeine and chocolate as these can increase heartburn symptoms.   

## 2021-10-21 ENCOUNTER — Encounter: Payer: Self-pay | Admitting: Family

## 2021-10-22 ENCOUNTER — Encounter: Payer: Self-pay | Admitting: Family

## 2021-10-22 NOTE — Progress Notes (Signed)
Sent to [facility] as requested for gallbladder healthy diet as well as more information on fatty liver.

## 2021-10-29 ENCOUNTER — Encounter: Payer: Self-pay | Admitting: Family

## 2021-10-31 ENCOUNTER — Other Ambulatory Visit (INDEPENDENT_AMBULATORY_CARE_PROVIDER_SITE_OTHER): Payer: BC Managed Care – PPO

## 2021-10-31 DIAGNOSIS — R7989 Other specified abnormal findings of blood chemistry: Secondary | ICD-10-CM

## 2021-10-31 LAB — HEPATIC FUNCTION PANEL
ALT: 17 U/L (ref 0–53)
AST: 16 U/L (ref 0–37)
Albumin: 4.8 g/dL (ref 3.5–5.2)
Alkaline Phosphatase: 56 U/L (ref 39–117)
Bilirubin, Direct: 0.2 mg/dL (ref 0.0–0.3)
Total Bilirubin: 0.8 mg/dL (ref 0.2–1.2)
Total Protein: 7.2 g/dL (ref 6.0–8.3)

## 2021-11-03 LAB — HEPATITIS PANEL, ACUTE
Hep A IgM: NONREACTIVE
Hep B C IgM: NONREACTIVE
Hepatitis B Surface Ag: NONREACTIVE
Hepatitis C Ab: NONREACTIVE

## 2021-11-10 DIAGNOSIS — S92112D Displaced fracture of neck of left talus, subsequent encounter for fracture with routine healing: Secondary | ICD-10-CM | POA: Diagnosis not present

## 2021-11-10 DIAGNOSIS — S92102D Unspecified fracture of left talus, subsequent encounter for fracture with routine healing: Secondary | ICD-10-CM | POA: Diagnosis not present

## 2021-11-13 ENCOUNTER — Ambulatory Visit: Payer: BC Managed Care – PPO | Admitting: Family

## 2021-11-21 ENCOUNTER — Ambulatory Visit: Payer: BC Managed Care – PPO | Admitting: Family

## 2021-11-26 NOTE — Telephone Encounter (Signed)
Patient has scheduled appointment no further action needed at this time.

## 2021-11-28 ENCOUNTER — Ambulatory Visit (INDEPENDENT_AMBULATORY_CARE_PROVIDER_SITE_OTHER): Payer: BC Managed Care – PPO | Admitting: Family

## 2021-11-28 ENCOUNTER — Encounter: Payer: Self-pay | Admitting: Family

## 2021-11-28 VITALS — BP 122/68 | HR 85 | Temp 98.0°F | Resp 16 | Ht 67.25 in | Wt 194.4 lb

## 2021-11-28 DIAGNOSIS — R1013 Epigastric pain: Secondary | ICD-10-CM | POA: Insufficient documentation

## 2021-11-28 DIAGNOSIS — R1030 Lower abdominal pain, unspecified: Secondary | ICD-10-CM | POA: Diagnosis not present

## 2021-11-28 DIAGNOSIS — K5900 Constipation, unspecified: Secondary | ICD-10-CM | POA: Diagnosis not present

## 2021-11-28 NOTE — Patient Instructions (Signed)
  Add fiber supplement once daily.  Add a probiotic (such as Florastor) daily. Drink 64 oz of water a day. Eat lots of fresh fruit and veggies. Ensure regular exercise.    If you are not able to have regular BM's with the above regimen, you may add miralax 1 tablespoon daily.  Increase or decrease amount/frequency as needed to ensure 1 soft BM/day.   Stop by the lab prior to leaving today. I will notify you of your results once received.    Regards,   Mort Sawyers FNP-C

## 2021-11-28 NOTE — Assessment & Plan Note (Signed)
Advised how to follow constipation regimen, recommended starting miralax and daily stool softener.  Handout given for fod diet.

## 2021-11-28 NOTE — Progress Notes (Signed)
Established Patient Office Visit  Subjective:  Patient ID: Theodore Greer, male    DOB: 1995-06-27  Age: 26 y.o. MRN: [id number]  CC:  Chief Complaint  Patient presents with   Heartburn   Chest Pain    After he eats and cramps in stomach    HPI Avrohom Mckelvin is here today for follow up.   Pt is with acute concerns.  Headaches: recently seen by eye doctor, and is noticing headaches are when he doesn't get enough sleep. When he sleeps better and more hours he has noticed he has less headaches.   No longer with aching chest pains, now lower or in epigastric area. After eating he notices this pain, typically thirty minutes after eating. He has not yet ate today. Burping sometimes but not abnormal. No nausea. Abdominal pain worsening slight in the last two weeks, comes and goes. Doesn't necessarily notice issue with ingesting animal meat. Does have issues with greasy foods.   No longer seeing blood in stool.  Acid reflux has improved.   Bowel movements: constipation, has not had a full bowel in the last two weeks, partial doesn't feel complete relief.   Abd u/s with fatty liver 9/28. No overly concerning findings, no acute findings.   One month ago with elevated LFT however now back to normal pt states working on diet and going to gym, and has lost 12 pounds.    Past Medical History:  Diagnosis Date   Acid reflux    Headache     Past Surgical History:  Procedure Laterality Date   ANKLE SURGERY Left    WRIST SURGERY Left    bone spur growth plate removed    Family History  Problem Relation Age of Onset   Healthy Mother    Healthy Father    Brain cancer Maternal Grandfather     Social History   Socioeconomic History   Marital status: Married    Spouse name: Not on file   Number of children: 0   Years of education: Not on file   Highest education level: Not on file  Occupational History    Comment: [employer]  Tobacco Use   Smoking status: Never    Smokeless tobacco: Former  Building services engineer Use: Former  Substance and Sexual Activity   Alcohol use: Yes    Comment: occ   Drug use: Not Currently    Types: Marijuana   Sexual activity: Yes    Partners: Female    Birth control/protection: None    Comment: wife is pregnant  Other Topics Concern   Not on file  Social History Narrative   Wife pregnant due in feb   Social Determinants of Health   Financial Resource Strain: Not on file  Food Insecurity: Not on file  Transportation Needs: Not on file  Physical Activity: Not on file  Stress: Not on file  Social Connections: Not on file  Intimate Partner Violence: Not on file    Outpatient Medications Prior to Visit  Medication Sig Dispense Refill   pantoprazole (PROTONIX) 40 MG tablet Take 1 tablet (40 mg total) by mouth daily. 30 tablet 0   No facility-administered medications prior to visit.    No Known Allergies    Review of Systems  Respiratory:  Negative for shortness of breath.   Cardiovascular:  Negative for chest pain and palpitations.  Gastrointestinal:  Negative for constipation and diarrhea.  Genitourinary:  Negative for dysuria, frequency and urgency.  Musculoskeletal:  Negative for myalgias.  Psychiatric/Behavioral:  Negative for depression and suicidal ideas.   All other systems reviewed and are negative.    Objective:    Physical Exam Constitutional:      Appearance: He is well-developed. He is obese.  Abdominal:     General: Bowel sounds are decreased.     Tenderness: There is abdominal tenderness in the right lower quadrant and left lower quadrant. Negative signs include Murphy's sign, McBurney's sign, psoas sign and obturator sign.     Hernia: No hernia is present.  Neurological:     Mental Status: He is alert.       BP 122/68   Pulse 85   Temp 98 F (36.7 C)   Resp 16   Ht 5' 7.25" (1.708 m)   Wt 194 lb 6 oz (88.2 kg)   SpO2 97%   BMI 30.22 kg/m  Wt Readings from Last 3  Encounters:  11/28/21 194 lb 6 oz (88.2 kg)  10/14/21 206 lb 6 oz (93.6 kg)  12/10/19 185 lb (83.9 kg)     Health Maintenance Due  Topic Date Due   COVID-19 Vaccine (1) Never done   HPV VACCINES (1 - Male 2-dose series) Never done   HIV Screening  Never done   INFLUENZA VACCINE  Never done       Topic Date Due   HPV VACCINES (1 - Male 2-dose series) Never done    Lab Results  Component Value Date   TSH 0.73 10/14/2021   Lab Results  Component Value Date   WBC 7.5 10/14/2021   HGB 15.1 10/14/2021   HCT 44.1 10/14/2021   MCV 89.6 10/14/2021   PLT 388.0 10/14/2021   Lab Results  Component Value Date   NA 137 10/14/2021   K 4.0 10/14/2021   CO2 26 10/14/2021   GLUCOSE 89 10/14/2021   BUN 13 10/14/2021   CREATININE 0.94 10/14/2021   BILITOT 0.8 10/31/2021   ALKPHOS 56 10/31/2021   AST 16 10/31/2021   ALT 17 10/31/2021   PROT 7.2 10/31/2021   ALBUMIN 4.8 10/31/2021   CALCIUM 9.5 10/14/2021   ANIONGAP 11 12/10/2019   GFR 113.68 10/14/2021   Lab Results  Component Value Date   CHOL 159 10/14/2021   Lab Results  Component Value Date   HDL 46.90 10/14/2021   Lab Results  Component Value Date   LDLCALC 99 10/14/2021   Lab Results  Component Value Date   TRIG 65.0 10/14/2021   Lab Results  Component Value Date   CHOLHDL 3 10/14/2021   No results found for: "HGBA1C"    Assessment & Plan:   Problem List Items Addressed This Visit       Other   Constipation    Advised how to follow constipation regimen, recommended starting miralax and daily stool softener.  Handout given for fod diet.       Relevant Orders   H. pylori breath test   Alpha-Gal Panel   Celiac Pnl 2 rflx Endomysial Ab Ttr   CALPROTECTIN   Epigastric pain - Primary    H pylori, alpha gal ordered pending results.  Celiac panel ordered as well.  Fod map diet explained to pt.  No relief with pantoprazole.  If negative workup consider MRCP       Relevant Orders   H. pylori  breath test   Alpha-Gal Panel   Other Visit Diagnoses     Lower abdominal pain  No orders of the defined types were placed in this encounter.   Follow-up: No follow-ups on file.    Eugenia Pancoast, FNP

## 2021-11-28 NOTE — Assessment & Plan Note (Signed)
H pylori, alpha gal ordered pending results.  Celiac panel ordered as well.  Fod map diet explained to pt.  No relief with pantoprazole.  If negative workup consider MRCP

## 2021-11-28 NOTE — Addendum Note (Signed)
Addended by: Karen Kays C on: 11/28/2021 10:04 AM   Modules accepted: Orders

## 2021-12-01 ENCOUNTER — Encounter: Payer: Self-pay | Admitting: Family

## 2021-12-01 NOTE — Telephone Encounter (Signed)
Noted  

## 2021-12-01 NOTE — Telephone Encounter (Signed)
I spoke with pt and advised pt that these labs take longer to process but they have been sent for processing and when get results back and Mort Sawyers FNP reviews the results and pt will be notified. Pt voiced understanding. Sending FYI to  Wallace CMA.

## 2021-12-03 LAB — ALPHA-GAL PANEL
Allergen, Mutton, f88: <0.1 kU/L
Allergen, Pork, f26: <0.1 kU/L
Beef: <0.1 kU/L
CLASS: 0
CLASS: 0
Class: 0
GALACTOSE-ALPHA-1,3-GALACTOSE IGE*: <0.1 kU/L (ref <0.10)

## 2021-12-03 LAB — INTERPRETATION:

## 2021-12-05 ENCOUNTER — Telehealth: Payer: Self-pay

## 2021-12-05 NOTE — Telephone Encounter (Signed)
Can we please let pt know to come in to get completed?

## 2021-12-05 NOTE — Telephone Encounter (Signed)
Called and made lab only for 12/19/2021

## 2021-12-05 NOTE — Telephone Encounter (Signed)
Received fax from [facility] that Calprotectin was not processed. No suitable specimen received. Did not see note making [name] aware of that in chart. I will send message now and await her instructions.

## 2021-12-09 LAB — CELIAC PNL 2 RFLX ENDOMYSIAL AB TTR
(tTG) Ab, IgA: <1 U/mL
(tTG) Ab, IgG: <1 U/mL
Endomysial Ab IgA: NEGATIVE
Gliadin IgA: <1 U/mL
Gliadin IgG: <1 U/mL
Immunoglobulin A: 78 mg/dL (ref 47–310)

## 2021-12-09 LAB — H. PYLORI BREATH TEST: H. pylori Breath Test: NOT DETECTED

## 2021-12-09 LAB — CALPROTECTIN

## 2021-12-16 ENCOUNTER — Encounter: Payer: Self-pay | Admitting: Family

## 2021-12-16 DIAGNOSIS — R10821 Right upper quadrant rebound abdominal tenderness: Secondary | ICD-10-CM

## 2021-12-16 DIAGNOSIS — R1013 Epigastric pain: Secondary | ICD-10-CM

## 2021-12-16 DIAGNOSIS — R1030 Lower abdominal pain, unspecified: Secondary | ICD-10-CM

## 2021-12-16 DIAGNOSIS — R0789 Other chest pain: Secondary | ICD-10-CM

## 2021-12-16 DIAGNOSIS — R7989 Other specified abnormal findings of blood chemistry: Secondary | ICD-10-CM

## 2021-12-16 NOTE — Telephone Encounter (Signed)
I spoke with pt and he said this stomach cramping has been going on for a while; in no distress; no abd pain and no vomiting, diarrhea or constipation or fever. Pt said stomach cramping under both ribs with nausea on and off; pt said wants to see T Dugal FNP only because she knows what is going on; pt kept lab appt on 12/19/21 and scheduled appt with T Dugal FNP on 12/22/21 at 12 noon with UC & ED precautions and pt voiced understanding. Sending note to Hayden Pedro FNP and D[facility]al pool.

## 2021-12-17 NOTE — Telephone Encounter (Signed)
Noted  

## 2021-12-17 NOTE — Telephone Encounter (Signed)
Called and infromed pt of this information.

## 2021-12-17 NOTE — Telephone Encounter (Signed)
Please let pt know I want to order something called an MCRP which will watch the contraction of the gallbladder. I have placed the order and he should get a call from our referral team when to set this up.

## 2021-12-19 ENCOUNTER — Other Ambulatory Visit: Payer: BC Managed Care – PPO

## 2021-12-19 DIAGNOSIS — K5900 Constipation, unspecified: Secondary | ICD-10-CM

## 2021-12-22 ENCOUNTER — Ambulatory Visit (INDEPENDENT_AMBULATORY_CARE_PROVIDER_SITE_OTHER): Payer: BC Managed Care – PPO | Admitting: Family

## 2021-12-22 ENCOUNTER — Encounter: Payer: Self-pay | Admitting: Family

## 2021-12-22 ENCOUNTER — Other Ambulatory Visit: Payer: Self-pay | Admitting: Family

## 2021-12-22 VITALS — BP 110/68 | HR 104 | Temp 97.9°F | Resp 16 | Ht 67.25 in | Wt 195.0 lb

## 2021-12-22 DIAGNOSIS — R198 Other specified symptoms and signs involving the digestive system and abdomen: Secondary | ICD-10-CM

## 2021-12-22 DIAGNOSIS — R1013 Epigastric pain: Secondary | ICD-10-CM

## 2021-12-22 DIAGNOSIS — R194 Change in bowel habit: Secondary | ICD-10-CM | POA: Diagnosis not present

## 2021-12-22 DIAGNOSIS — R12 Heartburn: Secondary | ICD-10-CM

## 2021-12-22 DIAGNOSIS — K5900 Constipation, unspecified: Secondary | ICD-10-CM

## 2021-12-22 MED ORDER — OMEPRAZOLE 20 MG PO CPDR: 20.0000 mg | DELAYED_RELEASE_CAPSULE | Freq: Every day | ORAL | 3 refills | Status: DC

## 2021-12-22 NOTE — Patient Instructions (Addendum)
  Trial omeprazole once daily.    Regards,   Mort Sawyers FNP-C

## 2021-12-22 NOTE — Assessment & Plan Note (Signed)
Suspected ibs However pending results of calprotectin to r/o IBD  Do suspect possible gluten intolerance or food intolerance and bloating experienced with foods. Recommended as well that pt cut down on fast food, try more food from home.

## 2021-12-22 NOTE — Assessment & Plan Note (Signed)
Trial omeprazole 20 mg sent to pharmacy  Unsuccessful trial pantoprazole  Try to decrease and or avoid spicy foods, fried fatty foods, and also caffeine and chocolate as these can increase heartburn symptoms.

## 2021-12-22 NOTE — Assessment & Plan Note (Signed)
Work on gerd with heartburn diet d/w pt  Negative h pylori and alpha gal

## 2021-12-22 NOTE — Progress Notes (Signed)
Established Patient Office Visit  Subjective:  Patient ID: Theodore Greer, male    DOB: 1995-02-08  Age: 26 y.o. MRN: [id number]  CC:  Chief Complaint  Patient presents with   GI Problem    Right under chest but sometimes in chest    HPI Theodore Greer is here today with concerns.   12/30/21 scheduled for MRCP  Abdominal u/s 9/28 fatty liver   Upper abdominal pain, does find more pain on right lower abdominal quadrant more so than on right side. When it occurs it is a sharp pain and will cause upper chest pain.   Trying to pay attention to food intake and what might set it off, but not overly sure what is causing this. He does find wen he doesn't eat he doesn't feel comfortable and symptoms are exacerbated.   H pylori was negative and alpha gal were also negative.  Frequency of pain is more frequent, few times a day will last for about five minutes then go away. The pain isn't excruciating but is uncomfortable, and he finds he burps more often than usual.   Alternates between constipation and diarrhea, always bloated.  No relief to pantoprazole 40 mg.   Meals:  Breakfast: english breakfast  Lunch: egg muffin from [employer], sometimes salads  Dinner: eats a lot of hibachi    Past Medical History:  Diagnosis Date   Acid reflux    Headache     Past Surgical History:  Procedure Laterality Date   ANKLE SURGERY Left    WRIST SURGERY Left    bone spur growth plate removed    Family History  Problem Relation Age of Onset   Healthy Mother    Healthy Father    Brain cancer Maternal Grandfather     Social History   Socioeconomic History   Marital status: Married    Spouse name: Not on file   Number of children: 0   Years of education: Not on file   Highest education level: Not on file  Occupational History    Comment: [employer]  Tobacco Use   Smoking status: Never   Smokeless tobacco: Former  Building services engineer Use: Former  Substance and  Sexual Activity   Alcohol use: Yes    Comment: occ   Drug use: Not Currently    Types: Marijuana   Sexual activity: Yes    Partners: Female    Birth control/protection: None    Comment: wife is pregnant  Other Topics Concern   Not on file  Social History Narrative   Wife pregnant due in feb   Social Determinants of Health   Financial Resource Strain: Not on file  Food Insecurity: Not on file  Transportation Needs: Not on file  Physical Activity: Not on file  Stress: Not on file  Social Connections: Not on file  Intimate Partner Violence: Not on file    No outpatient medications prior to visit.   No facility-administered medications prior to visit.    No Known Allergies      Objective:    Physical Exam Constitutional:      Appearance: Normal appearance. He is obese.  Cardiovascular:     Rate and Rhythm: Normal rate and regular rhythm.     Heart sounds: No murmur heard. Pulmonary:     Effort: Pulmonary effort is normal.     Breath sounds: Normal breath sounds.  Abdominal:     General: Abdomen is flat. Bowel sounds are increased.  Palpations: Abdomen is soft. There is no mass.     Tenderness: There is abdominal tenderness in the right lower quadrant, epigastric area and left lower quadrant. There is no right CVA tenderness, left CVA tenderness, guarding or rebound. Negative signs include Murphy's sign, McBurney's sign, psoas sign and obturator sign.     Hernia: No hernia is present.     Comments: Very mild ruq abdominal tenderness  Musculoskeletal:     Right lower leg: No edema.     Left lower leg: No edema.  Neurological:     Mental Status: He is alert.     BP 110/68   Pulse (!) 104   Temp 97.9 F (36.6 C)   Resp 16   Ht 5' 7.25" (1.708 m)   Wt 195 lb (88.5 kg)   SpO2 98%   BMI 30.31 kg/m  Wt Readings from Last 3 Encounters:  12/22/21 195 lb (88.5 kg)  11/28/21 194 lb 6 oz (88.2 kg)  10/14/21 206 lb 6 oz (93.6 kg)     Health Maintenance  Due  Topic Date Due   COVID-19 Vaccine (1) Never done   HPV VACCINES (1 - Male 2-dose series) Never done   HIV Screening  Never done   INFLUENZA VACCINE  Never done       Topic Date Due   HPV VACCINES (1 - Male 2-dose series) Never done    Lab Results  Component Value Date   TSH 0.73 10/14/2021   Lab Results  Component Value Date   WBC 7.5 10/14/2021   HGB 15.1 10/14/2021   HCT 44.1 10/14/2021   MCV 89.6 10/14/2021   PLT 388.0 10/14/2021   Lab Results  Component Value Date   NA 137 10/14/2021   K 4.0 10/14/2021   CO2 26 10/14/2021   GLUCOSE 89 10/14/2021   BUN 13 10/14/2021   CREATININE 0.94 10/14/2021   BILITOT 0.8 10/31/2021   ALKPHOS 56 10/31/2021   AST 16 10/31/2021   ALT 17 10/31/2021   PROT 7.2 10/31/2021   ALBUMIN 4.8 10/31/2021   CALCIUM 9.5 10/14/2021   ANIONGAP 11 12/10/2019   GFR 113.68 10/14/2021   No results found for: "HGBA1C"    Assessment & Plan:   Problem List Items Addressed This Visit       Other   Epigastric pain    Work on gerd with heartburn diet d/w pt  Negative h pylori and alpha gal       Alternating constipation and diarrhea    Suspected ibs However pending results of calprotectin to r/o IBD  Do suspect possible gluten intolerance or food intolerance and bloating experienced with foods. Recommended as well that pt cut down on fast food, try more food from home.        Relevant Orders   Ambulatory referral to Gastroenterology   Heartburn - Primary    Trial omeprazole 20 mg sent to pharmacy  Unsuccessful trial pantoprazole  Try to decrease and or avoid spicy foods, fried fatty foods, and also caffeine and chocolate as these can increase heartburn symptoms.        Relevant Medications   omeprazole (PRILOSEC) 20 MG capsule   Other Relevant Orders   Ambulatory referral to Gastroenterology   RESOLVED: Constipation    Meds ordered this encounter  Medications   omeprazole (PRILOSEC) 20 MG capsule    Sig: Take 1  capsule (20 mg total) by mouth daily.    Dispense:  30 capsule  Refill:  3    Order Specific Question:   Supervising Provider    Answer:   Diona Browner, AMY E [id number]    Follow-up: Return in about 1 month (around 01/22/2022) for f/u after procedure on GI concerns.    Eugenia Pancoast, FNP

## 2021-12-25 ENCOUNTER — Encounter: Payer: Self-pay | Admitting: Family

## 2021-12-25 DIAGNOSIS — R1013 Epigastric pain: Secondary | ICD-10-CM

## 2021-12-25 DIAGNOSIS — R12 Heartburn: Secondary | ICD-10-CM

## 2021-12-25 NOTE — Telephone Encounter (Signed)
Spoke with pharmacist at [facility].  Omeprazole is not covered by patient's insurance.  Preferred at Esomeprazole and Pantoprazole.

## 2021-12-26 MED ORDER — ESOMEPRAZOLE MAGNESIUM 40 MG PO CPDR: 40.0000 mg | DELAYED_RELEASE_CAPSULE | Freq: Every day | ORAL | 0 refills | Status: DC

## 2021-12-26 NOTE — Telephone Encounter (Signed)
Thank you Lupita Leash for taking the time to find this out for me, I appreciate it.  Meds have been sent in .

## 2021-12-27 MED ORDER — PANTOPRAZOLE SODIUM 40 MG PO TBEC: 40.0000 mg | DELAYED_RELEASE_TABLET | Freq: Every day | ORAL | 0 refills | Status: DC

## 2021-12-27 NOTE — Addendum Note (Signed)
Addended by: Mort Sawyers on: 12/27/2021 10:12 PM   Modules accepted: Orders

## 2021-12-28 LAB — CALPROTECTIN: Calprotectin: 33 mcg/g

## 2021-12-30 ENCOUNTER — Ambulatory Visit
Admission: RE | Admit: 2021-12-30 | Discharge: 2021-12-30 | Disposition: A | Discharge status: Home / Self Care | Payer: BC Managed Care – PPO | Source: Ambulatory Visit | Attending: Family | Admitting: Family

## 2021-12-30 DIAGNOSIS — R1013 Epigastric pain: Secondary | ICD-10-CM

## 2021-12-30 DIAGNOSIS — R0789 Other chest pain: Secondary | ICD-10-CM

## 2021-12-30 DIAGNOSIS — R10821 Right upper quadrant rebound abdominal tenderness: Secondary | ICD-10-CM

## 2021-12-30 DIAGNOSIS — R7989 Other specified abnormal findings of blood chemistry: Secondary | ICD-10-CM | POA: Diagnosis not present

## 2021-12-30 DIAGNOSIS — R1030 Lower abdominal pain, unspecified: Secondary | ICD-10-CM

## 2021-12-30 MED ORDER — GADOBENATE DIMEGLUMINE 529 MG/ML IV SOLN
18.0000 mL | Freq: Once | INTRAVENOUS | Status: AC | PRN
  Administered 2021-12-30: 18 mL via INTRAVENOUS

## 2022-01-06 IMAGING — CR DG CHEST 2V
1 series · 2 of 2 positions shown · non-contrast
Comparison: None

CLINICAL DATA: Chest pain, worse with movement

EXAM:
CHEST - 2 VIEW

[Series 1: dg chest 2 view · 0.14mm/px · 2 of 2 slices shown]
[im 1/2]
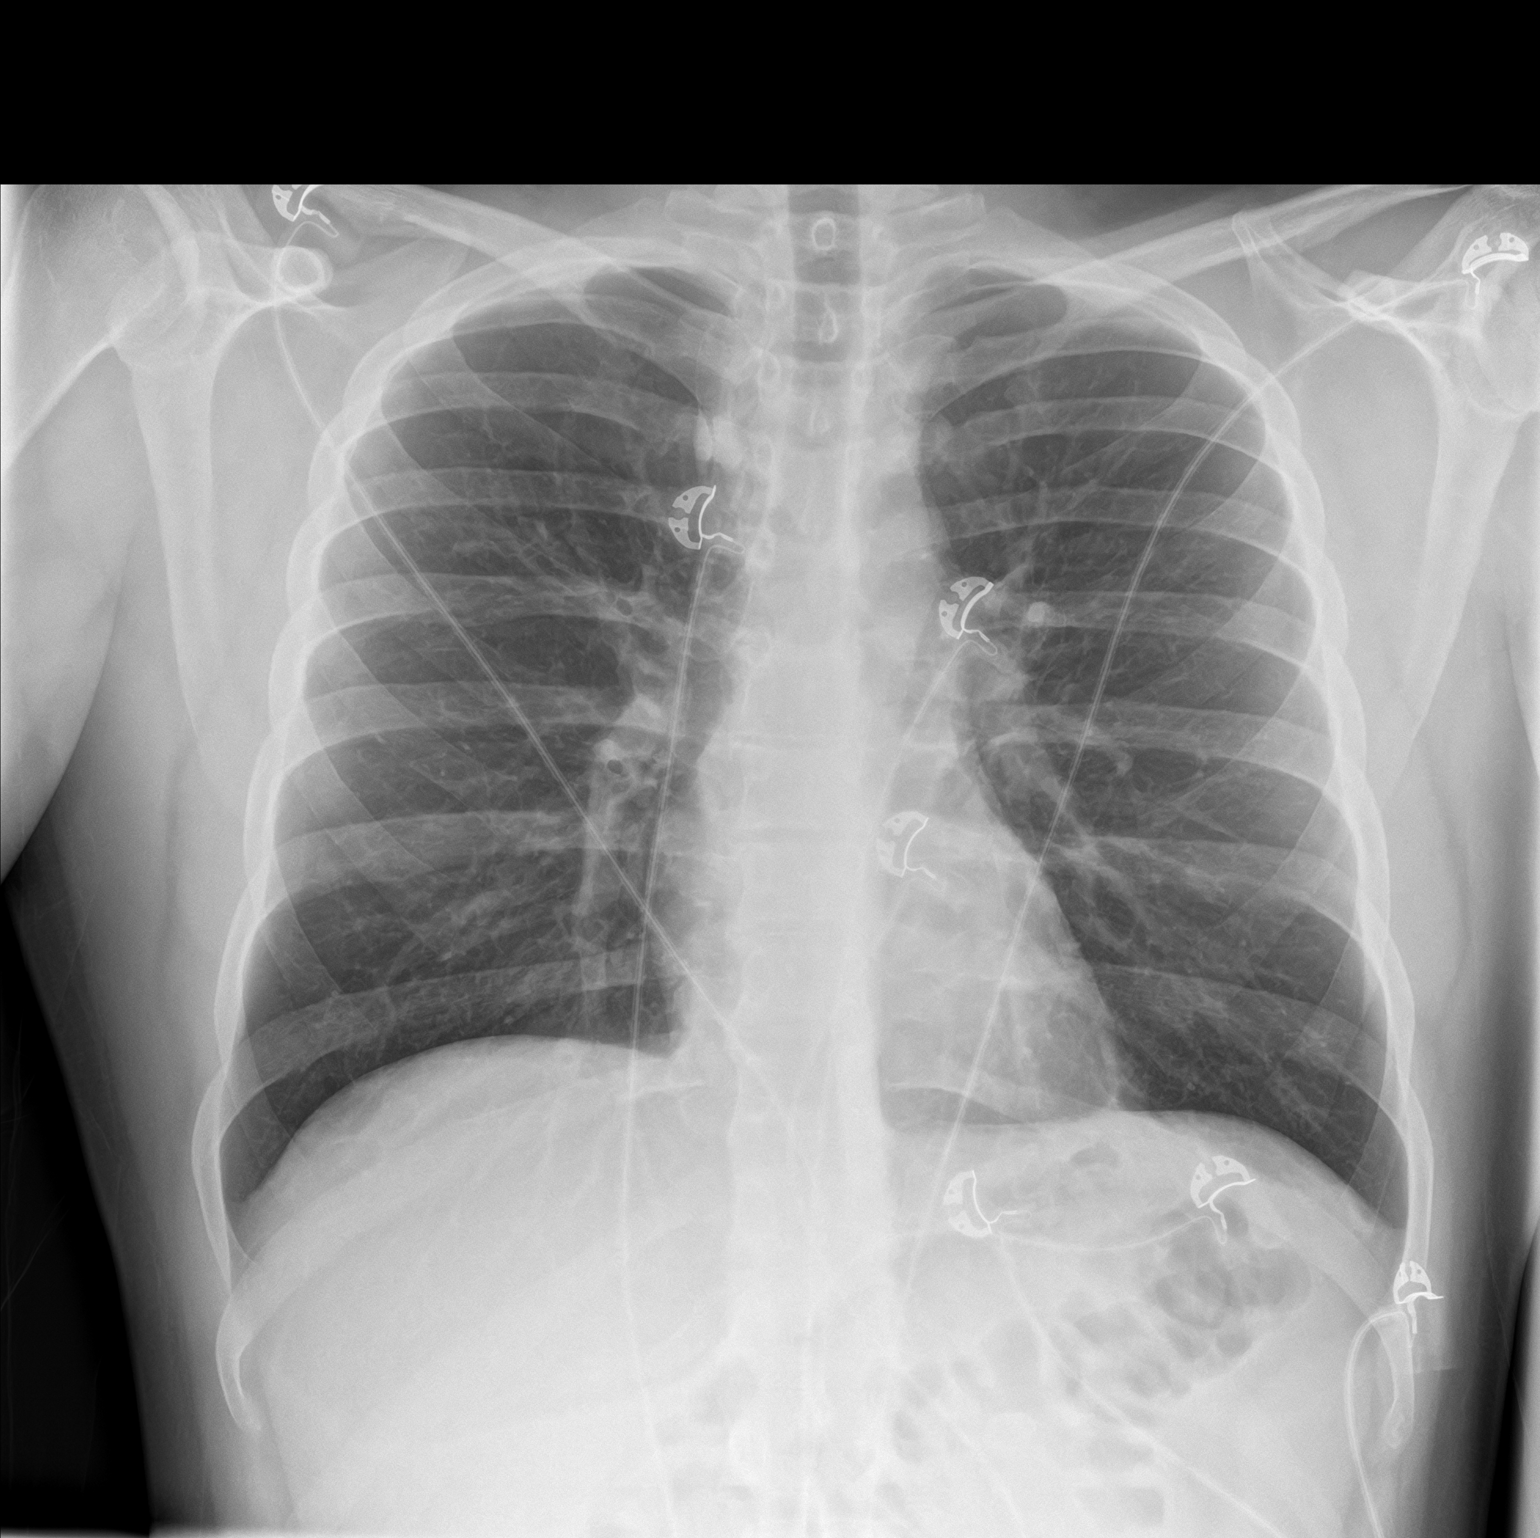
[im 2/2]
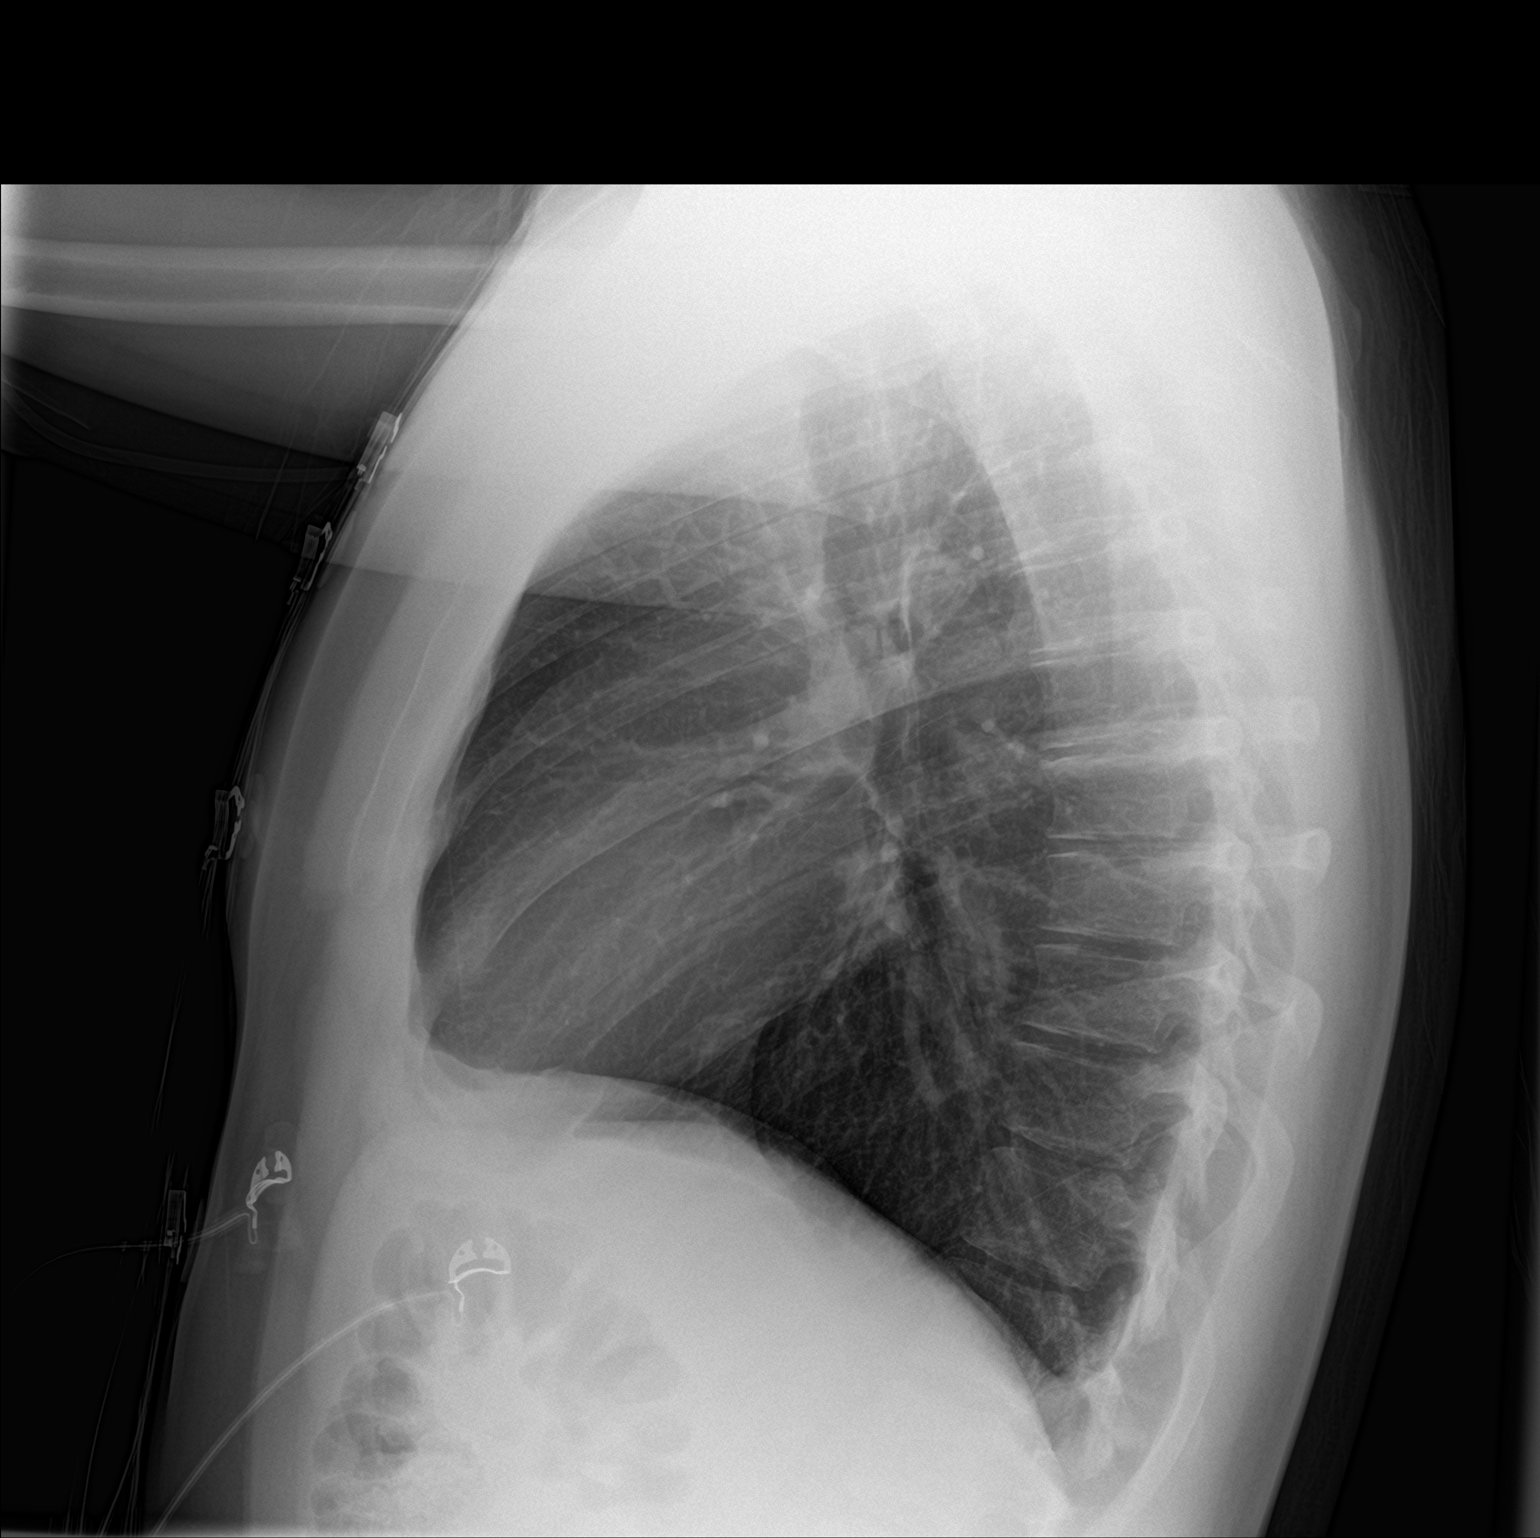

[2 of 2 positions shown; findings below may reference images not displayed]

FINDINGS: No consolidation, features of edema, pneumothorax, or effusion.
Pulmonary vascularity is normally distributed. The cardiomediastinal
contours are unremarkable. No acute osseous or soft tissue
abnormality.
IMPRESSION: No acute cardiopulmonary abnormality.

## 2022-01-13 DIAGNOSIS — J101 Influenza due to other identified influenza virus with other respiratory manifestations: Secondary | ICD-10-CM | POA: Diagnosis not present

## 2022-01-13 DIAGNOSIS — R509 Fever, unspecified: Secondary | ICD-10-CM | POA: Diagnosis not present

## 2022-01-19 ENCOUNTER — Other Ambulatory Visit: Payer: Self-pay | Admitting: Family

## 2022-05-11 ENCOUNTER — Encounter: Payer: Self-pay | Admitting: Gastroenterology

## 2022-05-11 ENCOUNTER — Ambulatory Visit (INDEPENDENT_AMBULATORY_CARE_PROVIDER_SITE_OTHER): Payer: No Typology Code available for payment source | Admitting: Gastroenterology

## 2022-05-11 ENCOUNTER — Other Ambulatory Visit: Payer: Self-pay

## 2022-05-11 VITALS — BP 138/89 | HR 106 | Temp 97.8°F | Ht 67.25 in | Wt 213.0 lb

## 2022-05-11 DIAGNOSIS — R1013 Epigastric pain: Secondary | ICD-10-CM

## 2022-05-11 DIAGNOSIS — R195 Other fecal abnormalities: Secondary | ICD-10-CM

## 2022-05-11 NOTE — Progress Notes (Signed)
Arlyss Repress, MD [address]  [address]  [place], [place] [address]  Main: [phone]  Fax: [phone]    Gastroenterology Consultation  Referring Provider:     Mort Sawyers, FNP Primary Care Physician:  Mort Sawyers, FNP Primary Gastroenterologist:  Dr. Arlyss Repress Reason for Consultation: Epigastric pain, loose stools        HPI:   Theodore Greer is a 27 y.o. male referred by Mort Sawyers, FNP  for consultation & management of epigastric pain, associated with loose stools.  Patient was originally evaluated by his PCP in 09/2021 for indigestion, acid reflux which history GERD after consumption of fatty foods.  Workup revealed elevated transaminases, acute viral hepatitis panel negative, ultrasound abdomen revealed fatty liver.  Patient was advised to follow healthy diet and repeat LFTs in 10/23 came back normal.  Due to ongoing symptoms, he underwent further workup including H. pylori breath test, alpha gal panel, celiac panel, fecal calprotectin levels which all came back negative.  Patient reports that he was following healthy diet, however for last 2 months since he has a newborn at home, he has not been able to focus on healthy meals.  He reports pain in bilateral subcostal area associated with loose stools.  Denies any rectal bleeding.  No evidence of anemia.  NSAIDs: None  Antiplts/Anticoagulants/Anti thrombotics: None  GI Procedures: None  Past Medical History:  Diagnosis Date   Acid reflux    Headache     Past Surgical History:  Procedure Laterality Date   ANKLE SURGERY Left    WRIST SURGERY Left    bone spur growth plate removed    No current outpatient medications on file.   Family History  Problem Relation Age of Onset   Healthy Mother    Healthy Father    Brain cancer Maternal Grandfather      Social History   Tobacco Use   Smoking status: Never   Smokeless tobacco: Former  Building services engineer Use: Former  Substance Use  Topics   Alcohol use: Yes    Comment: occ   Drug use: Not Currently    Types: Marijuana    Allergies as of 05/11/2022   (No Known Allergies)    Review of Systems:    All systems reviewed and negative except where noted in HPI.   Physical Exam:  BP 138/89 (BP Location: Left Arm, Patient Position: Sitting, Cuff Size: Normal)   Pulse (!) 106   Temp 97.8 F (36.6 C) (Oral)   Ht 5' 7.25" (1.708 m)   Wt 213 lb (96.6 kg)   BMI 33.11 kg/m  No LMP for male patient.  General:   Alert,  Well-developed, well-nourished, pleasant and cooperative in NAD Head:  Normocephalic and atraumatic. Eyes:  Sclera clear, no icterus.   Conjunctiva pink. Ears:  Normal auditory acuity. Nose:  No deformity, discharge, or lesions. Mouth:  No deformity or lesions,oropharynx pink & moist. Neck:  Supple; no masses or thyromegaly. Lungs:  Respirations even and unlabored.  Clear throughout to auscultation.   No wheezes, crackles, or rhonchi. No acute distress. Heart:  Regular rate and rhythm; no murmurs, clicks, rubs, or gallops. Abdomen:  Normal bowel sounds. Soft, non-tender and non-distended without masses, hepatosplenomegaly or hernias noted.  No guarding or rebound tenderness.   Rectal: Not performed Msk:  Symmetrical without gross deformities. Good, equal movement & strength bilaterally. Pulses:  Normal pulses noted. Extremities:  No clubbing or edema.  No cyanosis. Neurologic:  Alert and oriented x3;  grossly normal neurologically. Skin:  Intact without significant lesions or rashes. No jaundice. Psych:  Alert and cooperative. Normal mood and affect.  Imaging Studies: Reviewed  Assessment and Plan:   Theodore Greer is a 27 y.o. male with no significant past medical history, BMI 33 is seen in consultation for symptoms of nonbloody loose stools, epigastric discomfort  Ultrasound was unremarkable except for fatty liver.  MRCP was negative LFTs are back to normal Celiac panel, calprotectin  levels, H. pylori breath test, alpha gal panel were negative Recommend food allergy profile and EGD with esophageal biopsies Reiterated on healthy eating habits  Follow up based on the above workup   Arlyss Repress, MD

## 2022-05-16 LAB — FOOD ALLERGY PROFILE
Allergen Corn, IgE: <0.1 kU/L
Clam IgE: <0.1 kU/L
Codfish IgE: <0.1 kU/L
Egg White IgE: <0.1 kU/L
Milk IgE: <0.1 kU/L
Peanut IgE: <0.1 kU/L
Scallop IgE: <0.1 kU/L
Sesame Seed IgE: <0.1 kU/L
Shrimp IgE: <0.1 kU/L
Soybean IgE: <0.1 kU/L
Walnut IgE: <0.1 kU/L
Wheat IgE: <0.1 kU/L

## 2022-05-26 ENCOUNTER — Other Ambulatory Visit: Payer: Self-pay

## 2022-05-26 ENCOUNTER — Ambulatory Visit
Admission: RE | Admit: 2022-05-26 | Discharge: 2022-05-26 | Disposition: A | Discharge status: Home / Self Care | Payer: No Typology Code available for payment source | Attending: Gastroenterology | Admitting: Gastroenterology

## 2022-05-26 ENCOUNTER — Encounter: Payer: Self-pay | Admitting: Gastroenterology

## 2022-05-26 ENCOUNTER — Ambulatory Visit: Payer: No Typology Code available for payment source | Admitting: Anesthesiology

## 2022-05-26 ENCOUNTER — Encounter
Admission: RE | Disposition: A | Discharge status: Home / Self Care | Payer: Self-pay | Source: Home / Self Care | Attending: Gastroenterology

## 2022-05-26 DIAGNOSIS — R1013 Epigastric pain: Secondary | ICD-10-CM | POA: Diagnosis present

## 2022-05-26 DIAGNOSIS — R519 Headache, unspecified: Secondary | ICD-10-CM | POA: Insufficient documentation

## 2022-05-26 DIAGNOSIS — K219 Gastro-esophageal reflux disease without esophagitis: Secondary | ICD-10-CM | POA: Diagnosis present

## 2022-05-26 DIAGNOSIS — R195 Other fecal abnormalities: Secondary | ICD-10-CM

## 2022-05-26 HISTORY — PX: ESOPHAGOGASTRODUODENOSCOPY (EGD) WITH PROPOFOL: SHX5813

## 2022-05-26 SURGERY — ESOPHAGOGASTRODUODENOSCOPY (EGD) WITH PROPOFOL
Anesthesia: General

## 2022-05-26 MED ORDER — PROPOFOL 500 MG/50ML IV EMUL
INTRAVENOUS | Status: DC | PRN
  Administered 2022-05-26: 175 ug/kg/min via INTRAVENOUS

## 2022-05-26 MED ORDER — PROPOFOL 10 MG/ML IV BOLUS
INTRAVENOUS | Status: DC | PRN
  Administered 2022-05-26: 20 mg via INTRAVENOUS
  Administered 2022-05-26: 100 mg via INTRAVENOUS
  Administered 2022-05-26: 30 mg via INTRAVENOUS

## 2022-05-26 MED ORDER — DEXMEDETOMIDINE HCL IN NACL 200 MCG/50ML IV SOLN
INTRAVENOUS | Status: DC | PRN
  Administered 2022-05-26 (×2): 8 ug via INTRAVENOUS
  Administered 2022-05-26: 4 ug via INTRAVENOUS

## 2022-05-26 MED ORDER — LIDOCAINE HCL (CARDIAC) PF 100 MG/5ML IV SOSY
PREFILLED_SYRINGE | INTRAVENOUS | Status: DC | PRN
  Administered 2022-05-26: 40 mg via INTRAVENOUS

## 2022-05-26 MED ORDER — SODIUM CHLORIDE 0.9 % IV SOLN: INTRAVENOUS | Status: DC

## 2022-05-26 NOTE — Anesthesia Procedure Notes (Signed)
Date/Time: 05/26/2022 10:54 AM  Performed by: Stormy Fabian, CRNAPre-anesthesia Checklist: Patient identified, Emergency Drugs available, Suction available and Patient being monitored Patient Re-evaluated:Patient Re-evaluated prior to induction Oxygen Delivery Method: Nasal cannula Induction Type: IV induction Dental Injury: Teeth and Oropharynx as per pre-operative assessment  Comments: Nasal cannula with etCO2 monitoring

## 2022-05-26 NOTE — Op Note (Signed)
[facility] Gastroenterology Patient Name: Theodore Greer Procedure Date: 05/26/2022 10:29 AM MRN: [id number] Account #: [id number] Date of Birth: 1995/10/27 Admit Type: Outpatient Age: 27 Room: [facility] ENDO ROOM 3 Gender: Male Note Status: Finalized Instrument Name: Upper Endoscope [id number] Procedure:             Upper GI endoscopy Indications:           Epigastric abdominal pain, , loose stools Providers:             Toney Reil MD, MD Referring MD:          Mort Sawyers (Referring MD) Medicines:             General Anesthesia Complications:         No immediate complications. Estimated blood loss: None. Procedure:             Pre-Anesthesia Assessment:                        - Prior to the procedure, a History and Physical was                         performed, and patient medications and allergies were                         reviewed. The patient is competent. The risks and                         benefits of the procedure and the sedation options and                         risks were discussed with the patient. All questions                         were answered and informed consent was obtained.                         Patient identification and proposed procedure were                         verified by the physician, the nurse, the                         anesthesiologist, the anesthetist and the technician                         in the pre-procedure area in the procedure room in the                         endoscopy suite. Mental Status Examination: alert and                         oriented. Airway Examination: normal oropharyngeal                         airway and neck mobility. Respiratory Examination:                         clear to auscultation. CV Examination: normal.  Prophylactic Antibiotics: The patient does not require                         prophylactic antibiotics. Prior Anticoagulants: The                          patient has taken no anticoagulant or antiplatelet                         agents. ASA Grade Assessment: II - A patient with mild                         systemic disease. After reviewing the risks and                         benefits, the patient was deemed in satisfactory                         condition to undergo the procedure. The anesthesia                         plan was to use general anesthesia. Immediately prior                         to administration of medications, the patient was                         re-assessed for adequacy to receive sedatives. The                         heart rate, respiratory rate, oxygen saturations,                         blood pressure, adequacy of pulmonary ventilation, and                         response to care were monitored throughout the                         procedure. The physical status of the patient was                         re-assessed after the procedure.                        After obtaining informed consent, the endoscope was                         passed under direct vision. Throughout the procedure,                         the patient's blood pressure, pulse, and oxygen                         saturations were monitored continuously. The Endoscope                         was introduced through the mouth, and advanced to the  second part of duodenum. The upper GI endoscopy was                         accomplished without difficulty. The patient tolerated                         the procedure well. Findings:      The duodenal bulb and second portion of the duodenum were normal.       Biopsies were taken with a cold forceps for histology.      The entire examined stomach was normal. Biopsies were taken with a cold       forceps for histology.      The cardia and gastric fundus were normal on retroflexion.      Esophagogastric landmarks were identified: the gastroesophageal junction       was  found at 40 cm from the incisors.      The gastroesophageal junction and examined esophagus were normal.       Biopsies were taken with a cold forceps for histology. Impression:            - Normal duodenal bulb and second portion of the                         duodenum. Biopsied.                        - Normal stomach. Biopsied.                        - Esophagogastric landmarks identified.                        - Normal gastroesophageal junction and esophagus.                         Biopsied. Recommendation:        - Await pathology results.                        - Discharge patient to home (with escort).                        - Resume previous diet today. Procedure Code(s):     --- Professional ---                        [phone], Esophagogastroduodenoscopy, flexible,                         transoral; with biopsy, single or multiple Diagnosis Code(s):     --- Professional ---                        R10.13, Epigastric pain CPT copyright 2022 American Medical Association. All rights reserved. The codes documented in this report are preliminary and upon coder review may  be revised to meet current compliance requirements. Dr. Libby Maw Toney Reil MD, MD 05/26/2022 11:04:08 AM This report has been signed electronically. Number of Addenda: 0 Note Initiated On: 05/26/2022 10:29 AM Estimated Blood Loss:  Estimated blood loss: none.      [facility]

## 2022-05-26 NOTE — Transfer of Care (Signed)
Immediate Anesthesia Transfer of Care Note  Patient: Theodore Greer  Procedure(s) Performed: Procedure(s): ESOPHAGOGASTRODUODENOSCOPY (EGD) WITH PROPOFOL (N/A)  Patient Location: PACU and Endoscopy Unit  Anesthesia Type:General  Level of Consciousness: sedated  Airway & Oxygen Therapy: Patient Spontanous Breathing and Patient connected to nasal cannula oxygen  Post-op Assessment: Report given to RN and Post -op Vital signs reviewed and stable  Post vital signs: Reviewed and stable  Last Vitals:  Vitals:   05/26/22 1016 05/26/22 1104  BP: 125/79 102/65  Pulse: 85 80  Resp: 16 11  Temp: (!) 36.1 C   SpO2: 98% 96%    Complications: No apparent anesthesia complications

## 2022-05-26 NOTE — Anesthesia Preprocedure Evaluation (Signed)
Anesthesia Evaluation  Patient identified by MRN, date of birth, ID band Patient awake    Reviewed: Allergy & Precautions, H&P , NPO status , Patient's Chart, lab work & pertinent test results, reviewed documented beta blocker date and time   History of Anesthesia Complications Negative for: history of anesthetic complications  Airway Mallampati: III  TM Distance: >3 FB Neck ROM: full    Dental  (+) Dental Advidsory Given, Teeth Intact, Chipped   Pulmonary neg pulmonary ROS   Pulmonary exam normal breath sounds clear to auscultation       Cardiovascular Exercise Tolerance: Good negative cardio ROS Normal cardiovascular exam Rhythm:regular Rate:Normal     Neuro/Psych negative neurological ROS  negative psych ROS   GI/Hepatic Neg liver ROS,GERD  ,,  Endo/Other  negative endocrine ROS    Renal/GU negative Renal ROS  negative genitourinary   Musculoskeletal   Abdominal   Peds  Hematology negative hematology ROS (+)   Anesthesia Other Findings Past Medical History: No date: Acid reflux No date: Headache   Reproductive/Obstetrics negative OB ROS                             Anesthesia Physical Anesthesia Plan  ASA: 2  Anesthesia Plan: General   Post-op Pain Management:    Induction: Intravenous  PONV Risk Score and Plan: 2 and Propofol infusion and TIVA  Airway Management Planned: Natural Airway and Nasal Cannula  Additional Equipment:   Intra-op Plan:   Post-operative Plan:   Informed Consent: I have reviewed the patients History and Physical, chart, labs and discussed the procedure including the risks, benefits and alternatives for the proposed anesthesia with the patient or authorized representative who has indicated his/her understanding and acceptance.     Dental Advisory Given  Plan Discussed with: Anesthesiologist, CRNA and Surgeon  Anesthesia Plan Comments:         Anesthesia Quick Evaluation

## 2022-05-26 NOTE — H&P (Signed)
  Arlyss Repress, MD [address]  [address]  [place], [place] [address]  Main: [phone]  Fax: [phone] Pager: [phone]  Primary Care Physician:  Mort Sawyers, FNP Primary Gastroenterologist:  Dr. Arlyss Repress  Pre-Procedure History & Physical: HPI:  Theodore Greer is a 27 y.o. male is here for an endoscopy.   Past Medical History:  Diagnosis Date   Acid reflux    Headache     Past Surgical History:  Procedure Laterality Date   ANKLE SURGERY Left    WRIST SURGERY Left    bone spur growth plate removed    Prior to Admission medications   Not on File    Allergies as of 05/11/2022   (No Known Allergies)    Family History  Problem Relation Age of Onset   Healthy Mother    Healthy Father    Brain cancer Maternal Grandfather     Social History   Socioeconomic History   Marital status: Married    Spouse name: Not on file   Number of children: 0   Years of education: Not on file   Highest education level: Not on file  Occupational History    Comment: [employer]  Tobacco Use   Smoking status: Never   Smokeless tobacco: Former  Building services engineer Use: Former  Substance and Sexual Activity   Alcohol use: Yes    Comment: occ   Drug use: Not Currently    Types: Marijuana   Sexual activity: Yes    Partners: Female    Birth control/protection: None    Comment: wife is pregnant  Other Topics Concern   Not on file  Social History Narrative   Wife pregnant due in feb   Social Determinants of Health   Financial Resource Strain: Not on file  Food Insecurity: Not on file  Transportation Needs: Not on file  Physical Activity: Not on file  Stress: Not on file  Social Connections: Not on file  Intimate Partner Violence: Not on file    Review of Systems: See HPI, otherwise negative ROS  Physical Exam: BP 125/79   Pulse 85   Temp (!) 97 F (36.1 C) (Temporal)   Resp 16   Wt 94.8 kg   SpO2 98%   BMI 32.49 kg/m  General:    Alert,  pleasant and cooperative in NAD Head:  Normocephalic and atraumatic. Neck:  Supple; no masses or thyromegaly. Lungs:  Clear throughout to auscultation.    Heart:  Regular rate and rhythm. Abdomen:  Soft, nontender and nondistended. Normal bowel sounds, without guarding, and without rebound.   Neurologic:  Alert and  oriented x4;  grossly normal neurologically.  Impression/Plan: Lazar Funaro is here for an endoscopy to be performed for nonbloody loose stools, epigastric discomfort   Risks, benefits, limitations, and alternatives regarding  endoscopy have been reviewed with the patient.  Questions have been answered.  All parties agreeable.   Lannette Donath, MD  05/26/2022, 10:32 AM

## 2022-05-27 ENCOUNTER — Encounter: Payer: Self-pay | Admitting: Gastroenterology

## 2022-05-27 LAB — SURGICAL PATHOLOGY

## 2022-05-28 NOTE — Progress Notes (Signed)
noted 

## 2022-05-30 NOTE — Anesthesia Postprocedure Evaluation (Signed)
Anesthesia Post Note  Patient: Theodore Greer  Procedure(s) Performed: ESOPHAGOGASTRODUODENOSCOPY (EGD) WITH PROPOFOL  Patient location during evaluation: Endoscopy Anesthesia Type: General Level of consciousness: awake and alert Pain management: pain level controlled Vital Signs Assessment: post-procedure vital signs reviewed and stable Respiratory status: spontaneous breathing, nonlabored ventilation, respiratory function stable and patient connected to nasal cannula oxygen Cardiovascular status: blood pressure returned to baseline and stable Postop Assessment: no apparent nausea or vomiting Anesthetic complications: no   No notable events documented.   Last Vitals:  Vitals:   05/26/22 1114 05/26/22 1124  BP: 104/60 110/75  Pulse: 76 75  Resp: 19 14  Temp:    SpO2: 96% 96%    Last Pain:  Vitals:   05/27/22 0732  TempSrc:   PainSc: 0-No pain                 Lenard Simmer

## 2022-10-20 ENCOUNTER — Ambulatory Visit (INDEPENDENT_AMBULATORY_CARE_PROVIDER_SITE_OTHER): Payer: No Typology Code available for payment source | Admitting: Family Medicine

## 2022-10-20 ENCOUNTER — Encounter: Payer: Self-pay | Admitting: Family Medicine

## 2022-10-20 VITALS — BP 118/72 | HR 95 | Temp 98.3°F | Ht 67.25 in | Wt 210.0 lb

## 2022-10-20 DIAGNOSIS — R21 Rash and other nonspecific skin eruption: Secondary | ICD-10-CM | POA: Diagnosis not present

## 2022-10-20 MED ORDER — PREDNISONE 10 MG PO TABS: ORAL_TABLET | ORAL | 0 refills | Status: DC

## 2022-10-20 MED ORDER — TRIAMCINOLONE ACETONIDE 0.1 % EX CREA: 1.0000 | TOPICAL_CREAM | Freq: Two times a day (BID) | CUTANEOUS | 1 refills | Status: AC | PRN

## 2022-10-20 NOTE — Patient Instructions (Signed)
Prednisone with food, triamcinolone if needed but not on the face.  Take care.  Glad to see you.

## 2022-10-20 NOTE — Assessment & Plan Note (Signed)
Benign appearing dermatitis, doesn't appear infectious.  D/w pt about options.  Prednisone with food, use TAC if needed but not on the face.  Update [place] as needed.  He agrees with plan.

## 2022-10-20 NOTE — Progress Notes (Signed)
Rash on patients back, posterior right knee and interior to right eye. Not on the palms.  Rash started 3 weeks ago. Has been applying cortisone cream to help control the itching.  H/o similar over the years.  Itchy but not painful.  No FCNAVD.   Meds, vitals, and allergies reviewed.   ROS: Per HPI unless specifically indicated in ROS section   Nad Ncat Neck supple, no LA Rrr Ctab No stridor.  No lip or tongue swelling.   Blanching reddish macules with irregular distribution trunk and extremities, also inferior to R eyelid.  Nondermatomal.  No ulceration or blistering.

## 2022-11-26 ENCOUNTER — Ambulatory Visit: Payer: No Typology Code available for payment source | Admitting: Family

## 2022-11-26 ENCOUNTER — Encounter: Payer: Self-pay | Admitting: Family

## 2022-11-26 VITALS — BP 120/78 | HR 89 | Temp 98.2°F | Ht 68.0 in | Wt 218.2 lb

## 2022-11-26 DIAGNOSIS — R21 Rash and other nonspecific skin eruption: Secondary | ICD-10-CM | POA: Diagnosis not present

## 2022-11-26 DIAGNOSIS — L237 Allergic contact dermatitis due to plants, except food: Secondary | ICD-10-CM | POA: Diagnosis not present

## 2022-11-26 MED ORDER — PREDNISONE 20 MG PO TABS
ORAL_TABLET | ORAL | 0 refills | Status: DC
Start: 2022-11-26 — End: 2022-12-14

## 2022-11-26 NOTE — Assessment & Plan Note (Signed)
Poison ivy dermatitis, psoriasis, eczema and or allergic dermatitis.as ddx Three week tapered prednisone.  Monitor rash if no improvement let me know, consider dermatology referral.  Triamincinolone cream prn.

## 2022-11-26 NOTE — Progress Notes (Signed)
Established Patient Office Visit  Subjective:   Patient ID: Theodore Greer, male    DOB: 01/19/1996  Age: 27 y.o. MRN: [id number]  CC:  Chief Complaint  Patient presents with   Rash    HPI: Theodore Greer is a 27 y.o. male presenting on 11/26/2022 for Rash  Rash on his collar bone, drying around his eyes, and on bil hands. Feels exceedingly dry.  Notes it is red and dry where the rash is located.  Was seen 10/1 by Dr. Para March and given one week of prednisone.   The rash comes and goes with how it is itchy. Seems to pop up in new places every other day. No change of vision or blurry vision. He does state a few days had eyes that were 'swollen' but since have come down and back to normal. No sob and or tongue feeling swollen. No issues with swallowing.   Food allergy panel 05/11/22 serum negative.       ROS: Negative unless specifically indicated above in HPI.   Relevant past medical history reviewed and updated as indicated.   Allergies and medications reviewed and updated.   Current Outpatient Medications:    predniSONE (DELTASONE) 20 MG tablet, Take two tablets po qd for five days, one tablet po qd for five days, then 1/2 tablet po qd for five days, Disp: 18 tablet, Rfl: 0   triamcinolone cream (KENALOG) 0.1 %, Apply 1 Application topically 2 (two) times daily as needed., Disp: 30 g, Rfl: 1  No Known Allergies  Objective:   BP 120/78 (BP Location: Right Arm, Patient Position: Sitting, Cuff Size: Normal)   Pulse 89   Temp 98.2 F (36.8 C) (Temporal)   Ht 5\' 8"  (1.727 m)   Wt 218 lb 3.2 oz (99 kg)   SpO2 97%   BMI 33.18 kg/m    Physical Exam Constitutional:      General: He is not in acute distress.    Appearance: Normal appearance. He is normal weight. He is not ill-appearing, toxic-appearing or diaphoretic.  Cardiovascular:     Rate and Rhythm: Normal rate.  Pulmonary:     Effort: Pulmonary effort is normal.  Musculoskeletal:        General: Normal  range of motion.  Skin:    Comments: Erythematic raised lesions x two left upper chest  Small appearing vesicular lesions as well.  Right thumb with scaling and erythema as well as between webbing of fingers.  Bil eyes with scaly flaky erythematic rash within eyelid   Neurological:     General: No focal deficit present.     Mental Status: He is alert and oriented to person, place, and time. Mental status is at baseline.  Psychiatric:        Mood and Affect: Mood normal.        Behavior: Behavior normal.        Thought Content: Thought content normal.        Judgment: Judgment normal.     Assessment & Plan:  Poison ivy dermatitis -     predniSONE; Take two tablets po qd for five days, one tablet po qd for five days, then 1/2 tablet po qd for five days  Dispense: 18 tablet; Refill: 0  Rash Assessment & Plan: Poison ivy dermatitis, psoriasis, eczema and or allergic dermatitis.as ddx Three week tapered prednisone.  Monitor rash if no improvement let me know, consider dermatology referral.  Triamincinolone cream prn.  Follow up plan: Return if symptoms worsen or fail to improve.  Mort Sawyers, FNP

## 2022-12-14 ENCOUNTER — Ambulatory Visit
Admission: RE | Admit: 2022-12-14 | Discharge: 2022-12-14 | Disposition: A | Discharge status: Home / Self Care | Payer: No Typology Code available for payment source | Source: Ambulatory Visit | Attending: Emergency Medicine | Admitting: Emergency Medicine

## 2022-12-14 ENCOUNTER — Ambulatory Visit: Payer: No Typology Code available for payment source

## 2022-12-14 VITALS — BP 127/86 | HR 104 | Temp 98.2°F | Resp 18

## 2022-12-14 DIAGNOSIS — J069 Acute upper respiratory infection, unspecified: Secondary | ICD-10-CM

## 2022-12-14 MED ORDER — LEVALBUTEROL TARTRATE 45 MCG/ACT IN AERO: 1.0000 | INHALATION_SPRAY | Freq: Four times a day (QID) | RESPIRATORY_TRACT | 0 refills | Status: DC | PRN

## 2022-12-14 MED ORDER — BENZONATATE 100 MG PO CAPS: 200.0000 mg | ORAL_CAPSULE | Freq: Three times a day (TID) | ORAL | 0 refills | Status: DC

## 2022-12-14 MED ORDER — PROMETHAZINE-DM 6.25-15 MG/5ML PO SYRP: 5.0000 mL | ORAL_SOLUTION | Freq: Four times a day (QID) | ORAL | 0 refills | Status: DC | PRN

## 2022-12-14 MED ORDER — AEROCHAMBER MV MISC: 2 refills | Status: DC

## 2022-12-14 MED ORDER — IPRATROPIUM BROMIDE 0.06 % NA SOLN: 2.0000 | Freq: Four times a day (QID) | NASAL | 12 refills | Status: DC

## 2022-12-14 NOTE — ED Provider Notes (Signed)
[facility]    CSN: [id number] Arrival date & time: 12/14/22  1338      History   Chief Complaint Chief Complaint  Patient presents with   Cough    Have really bad congestion and a cough and head ache from congestion - Entered by patient   Nasal Congestion   Sore Throat    HPI Theodore Greer is a 27 y.o. male.   HPI  27 year old male with past medical history significant for headaches and acid reflux presents for evaluation of 3 days with respiratory symptoms to include headaches, runny nose, nasal congestion, productive cough, shortness breath, and wheezing.  No fever.  His father-in-law has similar symptoms.  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 Authorizing Provider  benzonatate (TESSALON) 100 MG capsule Take 2 capsules (200 mg total) by mouth every 8 (eight) hours. 12/14/22  Yes Becky Augusta, NP  ipratropium (ATROVENT) 0.06 % nasal spray Place 2 sprays into both nostrils 4 (four) times daily. 12/14/22  Yes Becky Augusta, NP  levalbuterol [facility] HFA) 45  MCG/ACT inhaler Inhale 1-2 puffs into the lungs every 6 (six) hours as needed for wheezing. 12/14/22  Yes Becky Augusta, NP  promethazine-dextromethorphan (PROMETHAZINE-DM) 6.25-15 MG/5ML syrup Take 5 mLs by mouth 4 (four) times daily as needed. 12/14/22  Yes Becky Augusta, NP  Spacer/Aero-Holding Chambers (AEROCHAMBER MV) inhaler Use as instructed 12/14/22  Yes Becky Augusta, NP  triamcinolone cream (KENALOG) 0.1 % Apply 1 Application topically 2 (two) times daily as needed. 10/20/22   Joaquim Nam, MD    Family History Family History  Problem Relation Age of Onset   Healthy Mother    Healthy Father    Brain cancer Maternal Grandfather     Social History Social History   Tobacco Use   Smoking status: Never   Smokeless tobacco: Current  Vaping Use   Vaping status: Former  Substance Use Topics   Alcohol use: Yes    Comment: occ   Drug use: Not Currently    Types: Marijuana     Allergies   Patient has no known allergies.   Review of Systems Review of Systems  Constitutional:  Negative for fever.  HENT:  Positive for congestion and rhinorrhea. Negative for ear pain.   Respiratory:  Positive for cough, shortness of breath and wheezing.   Neurological:  Positive for headaches.     Physical Exam Triage Vital Signs ED Triage Vitals  Encounter Vitals Group     BP      Systolic BP Percentile  Diastolic BP Percentile      Pulse      Resp      Temp      Temp src      SpO2      Weight      Height      Head Circumference      Peak Flow      Pain Score      Pain Loc      Pain Education      Exclude from Growth Chart    No data found.  Updated Vital Signs BP 127/86   Pulse (!) 104   Temp 98.2 F (36.8 C) (Oral)   Resp 18   SpO2 99%   Visual Acuity Right Eye Distance:   Left Eye Distance:   Bilateral Distance:    Right Eye Near:   Left Eye Near:    Bilateral Near:     Physical Exam Vitals and nursing note reviewed.  Constitutional:       Appearance: Normal appearance. He is not ill-appearing.  HENT:     Head: Normocephalic and atraumatic.     Right Ear: Tympanic membrane, ear canal and external ear normal. There is no impacted cerumen.     Left Ear: Tympanic membrane, ear canal and external ear normal. There is no impacted cerumen.     Nose: Congestion and rhinorrhea present.     Comments: Patient mucosa is erythematous and edematous with clear discharge in both nares.    Mouth/Throat:     Mouth: Mucous membranes are moist.     Pharynx: Oropharynx is clear. Posterior oropharyngeal erythema present. No oropharyngeal exudate.     Comments: Mild erythema to the posterior oropharynx with clear postnasal drip. Cardiovascular:     Rate and Rhythm: Normal rate and regular rhythm.     Pulses: Normal pulses.     Heart sounds: Normal heart sounds. No murmur heard.    No friction rub. No gallop.  Pulmonary:     Effort: Pulmonary effort is normal.     Breath sounds: Normal breath sounds. No wheezing, rhonchi or rales.  Musculoskeletal:     Cervical back: Normal range of motion and neck supple. No tenderness.  Lymphadenopathy:     Cervical: No cervical adenopathy.  Skin:    General: Skin is warm and dry.     Capillary Refill: Capillary refill takes less than 2 seconds.     Findings: No rash.  Neurological:     General: No focal deficit present.     Mental Status: He is alert and oriented to person, place, and time.      UC Treatments / Results  Labs (all labs ordered are listed, but only abnormal results are displayed) Labs Reviewed - No data to display  EKG   Radiology DG Chest 2 View  Result Date: 12/14/2022 CLINICAL DATA:  Cough for 3 days EXAM: CHEST - 2 VIEW COMPARISON:  X-ray 12/09/2019 FINDINGS: The heart size and mediastinal contours are within normal limits. No consolidation, pneumothorax or effusion. No edema. The visualized skeletal structures are unremarkable. Air-fluid level along the stomach beneath the  left hemidiaphragm. Azygous fissure. IMPRESSION: No acute cardiopulmonary disease. Electronically Signed   By: Karen Kays M.D.   On: 12/14/2022 14:49    Procedures Procedures (including critical care time)  Medications Ordered in UC Medications - No data to display  Initial Impression / Assessment and Plan / UC Course  I have reviewed the triage vital signs and  the nursing notes.  Pertinent labs & imaging results that were available during my care of the patient were reviewed by me and considered in my medical decision making (see chart for details).   Patient is a pleasant, nontoxic-appearing 27 year old gentleman presenting for evaluation of respiratory symptoms as outlined HPI above.  He does have inflamed nasal mucosa with clear rhinorrhea and clear postnasal drip.  Tonsillar pillars are unremarkable and there is no exudate appreciated on exam.  Adenopathy present.  Cardiopulmonary exam reveals clear lung sounds in all fields.  Given that the patient is having a productive cough I will obtain a chest x-ray to evaluate for the presence of any acute cardiopulmonary pathology.  Otherwise his exam is consistent with a viral upper respiratory infection.  He is outside the therapeutic window for Tamiflu so will not test him for influenza at this time.  We discussed COVID testing but patient is declining at this time.  Chest x-ray independently reviewed and evaluated by me.  Impression: There is a questionable streaky infiltrate in the left lower lobe.  Cardiomediastinal silhouette appears normal.  Radiology overread is pending. Radiology impression states no active cardiopulmonary disease.  I will discharge patient home with a diagnosis of viral URI with a cough with prescription retronasal spray, Tessalon Perles, Promethazine DM cough syrup.  Return precautions reviewed.  Work note provided.   Final Clinical Impressions(s) / UC Diagnoses   Final diagnoses:  Viral URI with cough      Discharge Instructions      Your chest x-ray did not demonstrate any evidence of pneumonia.  I do believe you have a viral respiratory infection that is causing your symptoms.  Use the Xopenex inhaler with the spacer, 1 to 2 puffs every 6 hours, as needed for shortness breath or wheezing.  Use over-the-counter Tylenol and/or ibuprofen according to package instructions as needed for any pain.  Use the Atrovent nasal spray, 2 squirts in each nostril every 6 hours, as needed for runny nose and postnasal drip.  Use the Tessalon Perles every 8 hours during the day.  Take them with a small sip of water.  They may give you some numbness to the base of your tongue or a metallic taste in your mouth, this is normal.  Use the Promethazine DM cough syrup at bedtime for cough and congestion.  It will make you drowsy so do not take it during the day.  Return for reevaluation or see your primary care provider for any new or worsening symptoms.      ED Prescriptions     Medication Sig Dispense Auth. Provider   benzonatate (TESSALON) 100 MG capsule Take 2 capsules (200 mg total) by mouth every 8 (eight) hours. 21 capsule Becky Augusta, NP   ipratropium (ATROVENT) 0.06 % nasal spray Place 2 sprays into both nostrils 4 (four) times daily. 15 mL Becky Augusta, NP   promethazine-dextromethorphan (PROMETHAZINE-DM) 6.25-15 MG/5ML syrup Take 5 mLs by mouth 4 (four) times daily as needed. 118 mL Becky Augusta, NP   Spacer/Aero-Holding Chambers (AEROCHAMBER MV) inhaler Use as instructed 1 each Becky Augusta, NP   levalbuterol [facility] HFA) 45 MCG/ACT inhaler Inhale 1-2 puffs into the lungs every 6 (six) hours as needed for wheezing. 45 g Becky Augusta, NP      PDMP not reviewed this encounter.   Becky Augusta, NP 12/14/22 1454

## 2022-12-14 NOTE — ED Triage Notes (Signed)
Pt presents with a cough and congestion x 3 days. Pt has tried OTC cold medication with no relief.

## 2022-12-14 NOTE — Discharge Instructions (Addendum)
Your chest x-ray did not demonstrate any evidence of pneumonia.  I do believe you have a viral respiratory infection that is causing your symptoms.  Use the Xopenex inhaler with the spacer, 1 to 2 puffs every 6 hours, as needed for shortness breath or wheezing.  Use over-the-counter Tylenol and/or ibuprofen according to package instructions as needed for any pain.  Use the Atrovent nasal spray, 2 squirts in each nostril every 6 hours, as needed for runny nose and postnasal drip.  Use the Tessalon Perles every 8 hours during the day.  Take them with a small sip of water.  They may give you some numbness to the base of your tongue or a metallic taste in your mouth, this is normal.  Use the Promethazine DM cough syrup at bedtime for cough and congestion.  It will make you drowsy so do not take it during the day.  Return for reevaluation or see your primary care provider for any new or worsening symptoms.

## 2023-02-08 ENCOUNTER — Encounter: Payer: Self-pay | Admitting: Family

## 2023-02-10 ENCOUNTER — Ambulatory Visit
Admission: RE | Admit: 2023-02-10 | Discharge: 2023-02-10 | Disposition: A | Discharge status: Home / Self Care | Payer: No Typology Code available for payment source | Source: Ambulatory Visit | Attending: Physician Assistant | Admitting: Physician Assistant

## 2023-02-10 VITALS — BP 120/82 | HR 112 | Temp 98.2°F | Resp 18

## 2023-02-10 DIAGNOSIS — R051 Acute cough: Secondary | ICD-10-CM | POA: Diagnosis not present

## 2023-02-10 DIAGNOSIS — J029 Acute pharyngitis, unspecified: Secondary | ICD-10-CM | POA: Insufficient documentation

## 2023-02-10 DIAGNOSIS — J205 Acute bronchitis due to respiratory syncytial virus: Secondary | ICD-10-CM | POA: Diagnosis not present

## 2023-02-10 LAB — GROUP A STREP BY PCR: Group A Strep by PCR: NOT DETECTED

## 2023-02-10 LAB — RESP PANEL BY RT-PCR (FLU A&B, COVID) ARPGX2
Influenza A by PCR: NEGATIVE
Influenza B by PCR: NEGATIVE
SARS Coronavirus 2 by RT PCR: NEGATIVE

## 2023-02-10 MED ORDER — PROMETHAZINE-DM 6.25-15 MG/5ML PO SYRP: 5.0000 mL | ORAL_SOLUTION | Freq: Four times a day (QID) | ORAL | 0 refills | Status: DC | PRN

## 2023-02-10 NOTE — ED Triage Notes (Signed)
Pt c/o cough, chest congestion and sore throat since yesterday.

## 2023-02-10 NOTE — ED Provider Notes (Signed)
[facility]    CSN: [id number] Arrival date & time: 02/10/23  1841      History   Chief Complaint Chief Complaint  Patient presents with   Sore Throat    Chest hurts Throat hurts little Headaches kinda Runny nose stuffy - Entered by patient   Cough   chest congestion     HPI Theodore Greer is a 28 y.o. male presenting for 2-day history of fatigue, cough, congestion, sore throat.  Denies fever, sinus pain, shortness of breath, wheezing.  Reports his daughter is sick with similar symptoms.  He has been taking Tylenol cold and flu medicine.  No history of asthma or cardiopulmonary disease.  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 Authorizing Provider  promethazine-dextromethorphan (PROMETHAZINE-DM) 6.25-15 MG/5ML syrup Take 5 mLs by mouth 4 (four) times daily as needed. 02/10/23  Yes Shirlee Latch, PA-C  levalbuterol [facility] HFA) 45 MCG/ACT inhaler Inhale 1-2 puffs into the lungs every 6 (six) hours as needed for wheezing. 12/14/22   Becky Augusta, NP   Spacer/Aero-Holding Deretha Emory (AEROCHAMBER MV) inhaler Use as instructed 12/14/22   Becky Augusta, NP  triamcinolone cream (KENALOG) 0.1 % Apply 1 Application topically 2 (two) times daily as needed. 10/20/22   Joaquim Nam, MD    Family History Family History  Problem Relation Age of Onset   Healthy Mother    Healthy Father    Brain cancer Maternal Grandfather     Social History Social History   Tobacco Use   Smoking status: Never   Smokeless tobacco: Former  Building services engineer status: Former  Substance Use Topics   Alcohol use: Yes    Comment: occ   Drug use: Not Currently    Types: Marijuana     Allergies   Patient has no known allergies.   Review of Systems Review of Systems  Constitutional:  Positive for fatigue. Negative for fever.  HENT:  Positive for congestion, rhinorrhea and sore throat. Negative for sinus pressure and sinus pain.   Respiratory:  Positive for cough. Negative for shortness of breath.   Cardiovascular:  Positive for chest pain (when coughing).  Gastrointestinal:  Negative for abdominal pain, diarrhea, nausea and vomiting.  Musculoskeletal:  Negative for myalgias.  Neurological:  Negative for weakness, light-headedness and headaches.  Hematological:  Negative for adenopathy.     Physical Exam Triage Vital Signs ED Triage Vitals  Encounter Vitals Group     BP 02/10/23 1858 120/82     Systolic BP Percentile --  Diastolic BP Percentile --      Pulse Rate 02/10/23 1858 (!) 112     Resp 02/10/23 1858 18     Temp 02/10/23 1858 98.2 F (36.8 C)     Temp src --      SpO2 02/10/23 1858 97 %     Weight --      Height --      Head Circumference --      Peak Flow --      Pain Score 02/10/23 1857 5     Pain Loc --      Pain Education --      Exclude from Growth Chart --    No data found.  Updated Vital Signs BP 120/82 (BP Location: Right Arm)   Pulse (!) 112   Temp 98.2 F (36.8 C)   Resp 18   SpO2 97%      Physical  Exam Vitals and nursing note reviewed.  Constitutional:      General: He is not in acute distress.    Appearance: Normal appearance. He is well-developed. He is not ill-appearing.  HENT:     Head: Normocephalic and atraumatic.     Nose: Congestion present.     Mouth/Throat:     Mouth: Mucous membranes are moist.     Pharynx: Oropharynx is clear. Posterior oropharyngeal erythema present.  Eyes:     General: No scleral icterus.    Conjunctiva/sclera: Conjunctivae normal.  Cardiovascular:     Rate and Rhythm: Regular rhythm. Tachycardia present.  Pulmonary:     Effort: Pulmonary effort is normal. No respiratory distress.     Breath sounds: Rhonchi (bilateral upper lung fields) present.  Musculoskeletal:     Cervical back: Neck supple.  Skin:    General: Skin is warm and dry.     Capillary Refill: Capillary refill takes less than 2 seconds.  Neurological:     General: No focal deficit present.     Mental Status: He is alert. Mental status is at baseline.     Motor: No weakness.     Gait: Gait normal.  Psychiatric:        Mood and Affect: Mood normal.        Behavior: Behavior normal.      UC Treatments / Results  Labs (all labs ordered are listed, but only abnormal results are displayed) Labs Reviewed  RESP PANEL BY RT-PCR (FLU A&B, COVID) ARPGX2  GROUP A STREP BY PCR    EKG   Radiology No results found.  Procedures Procedures (including critical care time)  Medications Ordered in UC Medications - No data to display  Initial Impression / Assessment and Plan / UC Course  I have reviewed the triage vital signs and the nursing notes.  Pertinent labs & imaging results that were available during my care of the patient were reviewed by me and considered in my medical decision making (see chart for details).   28 year old male presents for 2-day history of fatigue, cough, congestion and sore throat.  Denies fever or shortness of breath.  Daughter is sick with similar  symptoms.  Patient is overall well-appearing.  No acute distress.  On exam has mild nasal congestion and erythema posterior pharynx. Few scattered rhonchi.   Resp panel and strep testing obtained.  Get a flu, COVID and strep.  Positive RSV.  Reviewed results with patient.  Explained to patient that he has RSV bronchitis.  Supportive care encouraged with increasing rest and fluids.  Sent Promethazine DM to pharmacy.  Advised for him to return if fever, weakness, increased breathing problem but explained that symptoms can last for a few weeks.   Final Clinical Impressions(s) / UC Diagnoses   Final diagnoses:  Acute cough  Sore throat  RSV bronchitis     Discharge Instructions      -You are positive for RSV ( a respiratory virus) .  -Symptoms related RSV bronchitis can last a couple weeks. - I sent cough medicine to the pharmacy.  Increase rest and fluids. - You need to be seen again if you develop a fever or have worsening breathing problem.     ED Prescriptions     Medication Sig Dispense Auth. Provider   promethazine-dextromethorphan (PROMETHAZINE-DM) 6.25-15 MG/5ML syrup Take 5 mLs by mouth 4 (four) times daily as needed. 118 mL Shirlee Latch, PA-C      PDMP not reviewed this encounter.   Shirlee Latch, PA-C 02/10/23 1954

## 2023-02-10 NOTE — Discharge Instructions (Addendum)
-  You are positive for RSV ( a respiratory virus) .  -Symptoms related RSV bronchitis can last a couple weeks. - I sent cough medicine to the pharmacy.  Increase rest and fluids. - You need to be seen again if you develop a fever or have worsening breathing problem.

## 2023-02-15 ENCOUNTER — Ambulatory Visit: Payer: Self-pay

## 2023-02-16 ENCOUNTER — Ambulatory Visit
Admission: RE | Admit: 2023-02-16 | Discharge: 2023-02-16 | Disposition: A | Discharge status: Home / Self Care | Payer: No Typology Code available for payment source | Source: Ambulatory Visit | Attending: Physician Assistant | Admitting: Physician Assistant

## 2023-02-16 ENCOUNTER — Ambulatory Visit (INDEPENDENT_AMBULATORY_CARE_PROVIDER_SITE_OTHER): Payer: No Typology Code available for payment source

## 2023-02-16 VITALS — BP 119/92 | HR 108 | Temp 98.2°F | Resp 16 | Ht 68.0 in | Wt 200.0 lb

## 2023-02-16 DIAGNOSIS — J209 Acute bronchitis, unspecified: Secondary | ICD-10-CM

## 2023-02-16 DIAGNOSIS — J019 Acute sinusitis, unspecified: Secondary | ICD-10-CM | POA: Diagnosis not present

## 2023-02-16 DIAGNOSIS — R062 Wheezing: Secondary | ICD-10-CM

## 2023-02-16 DIAGNOSIS — R051 Acute cough: Secondary | ICD-10-CM | POA: Diagnosis not present

## 2023-02-16 MED ORDER — AMOXICILLIN-POT CLAVULANATE 875-125 MG PO TABS: 1.0000 | ORAL_TABLET | Freq: Two times a day (BID) | ORAL | 0 refills | Status: AC

## 2023-02-16 MED ORDER — IPRATROPIUM BROMIDE 0.06 % NA SOLN: 2.0000 | Freq: Four times a day (QID) | NASAL | 0 refills | Status: DC

## 2023-02-16 MED ORDER — PSEUDOEPH-BROMPHEN-DM 30-2-10 MG/5ML PO SYRP: 10.0000 mL | ORAL_SOLUTION | Freq: Four times a day (QID) | ORAL | 0 refills | Status: AC | PRN

## 2023-02-16 MED ORDER — ALBUTEROL SULFATE HFA 108 (90 BASE) MCG/ACT IN AERS: 1.0000 | INHALATION_SPRAY | Freq: Four times a day (QID) | RESPIRATORY_TRACT | 0 refills | Status: DC | PRN

## 2023-02-16 MED ORDER — PREDNISONE 20 MG PO TABS: 40.0000 mg | ORAL_TABLET | Freq: Every day | ORAL | 0 refills | Status: AC

## 2023-02-16 NOTE — ED Provider Notes (Signed)
[facility]    CSN: [id number] Arrival date & time: 02/16/23  1719      History   Chief Complaint Chief Complaint  Patient presents with   Cough    Appt   Headache   Nasal Congestion    HPI Theodore Greer is a 28 y.o. male presenting for 8-day history of fatigue, cough, congestion, sore throat.  Patient seen here by me about a week ago and was positive for RSV. He has been taking Tylenol cold and flu medicine and also tried the Promethazine DM without relief.  Over the past few days he has developed significant left-sided sinus pressure and discomfort behind his eye, discolored yellowish nasal drainage, chest tightness, shortness of breath and wheezing.  No history of asthma or cardiopulmonary disease.  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 Authorizing Provider  albuterol (VENTOLIN HFA) 108 (90 Base) MCG/ACT inhaler Inhale 1-2 puffs into the lungs every 6 (six) hours as needed for wheezing or shortness of breath. 02/16/23  Yes Eusebio Friendly B, PA-C   amoxicillin-clavulanate (AUGMENTIN) 875-125 MG tablet Take 1 tablet by mouth every 12 (twelve) hours for 7 days. 02/16/23 02/23/23 Yes Eusebio Friendly B, PA-C  brompheniramine-pseudoephedrine-DM 30-2-10 MG/5ML syrup Take 10 mLs by mouth 4 (four) times daily as needed for up to 7 days. 02/16/23 02/23/23 Yes Eusebio Friendly B, PA-C  ipratropium (ATROVENT) 0.06 % nasal spray Place 2 sprays into both nostrils 4 (four) times daily. 02/16/23  Yes Shirlee Latch, PA-C  predniSONE (DELTASONE) 20 MG tablet Take 2 tablets (40 mg total) by mouth daily for 5 days. 02/16/23 02/21/23 Yes Shirlee Latch, PA-C  Spacer/Aero-Holding Chambers (AEROCHAMBER MV) inhaler Use as instructed 12/14/22  Yes Becky Augusta, NP  levalbuterol [facility] HFA) 45 MCG/ACT inhaler Inhale 1-2 puffs into the lungs every 6 (six) hours as needed for wheezing. 12/14/22   Becky Augusta, NP  triamcinolone cream (KENALOG) 0.1 % Apply 1 Application topically 2 (two) times daily as needed. 10/20/22   Joaquim Nam, MD    Family History Family History  Problem Relation Age of Onset   Healthy Mother    Healthy Father    Brain cancer Maternal Grandfather     Social History Social History   Tobacco Use   Smoking status: Never   Smokeless tobacco: Former  Building services engineer status: Former  Substance Use Topics   Alcohol use: Yes    Comment: occ   Drug use: Not Currently    Types: Marijuana     Allergies  Patient has no known allergies.   Review of Systems Review of Systems  Constitutional:  Positive for fatigue. Negative for fever.  HENT:  Positive for congestion, rhinorrhea, sinus pressure, sinus pain and sore throat.   Respiratory:  Positive for cough, chest tightness and shortness of breath.   Cardiovascular:  Positive for chest pain (when coughing).  Gastrointestinal:  Negative for abdominal pain, diarrhea, nausea and vomiting.  Musculoskeletal:  Negative for myalgias.  Neurological:  Positive for headaches. Negative for  weakness and light-headedness.  Hematological:  Negative for adenopathy.     Physical Exam Triage Vital Signs ED Triage Vitals  Encounter Vitals Group     BP 02/10/23 1858 120/82     Systolic BP Percentile --      Diastolic BP Percentile --      Pulse Rate 02/10/23 1858 (!) 112     Resp 02/10/23 1858 18     Temp 02/10/23 1858 98.2 F (36.8 C)     Temp src --      SpO2 02/10/23 1858 97 %     Weight --      Height --      Head Circumference --      Peak Flow --      Pain Score 02/10/23 1857 5     Pain Loc --      Pain Education --      Exclude from Growth Chart --    No data found.  Updated Vital Signs BP (!) 119/92 (BP Location: Left Arm)   Pulse (!) 108   Temp 98.2 F (36.8 C) (Oral)   Resp 16   Ht 5\' 8"  (1.727 m)   Wt 200 lb (90.7 kg)   SpO2 100%   BMI 30.41 kg/m      Physical Exam Vitals and nursing note reviewed.  Constitutional:      General: He is not in acute distress.    Appearance: Normal appearance. He is well-developed. He is ill-appearing.  HENT:     Head: Normocephalic and atraumatic.     Nose: Congestion present.     Left Sinus: Maxillary sinus tenderness and frontal sinus tenderness present.     Mouth/Throat:     Mouth: Mucous membranes are moist.     Pharynx: Oropharynx is clear. Posterior oropharyngeal erythema present.  Eyes:     General: No scleral icterus.    Conjunctiva/sclera: Conjunctivae normal.  Cardiovascular:     Rate and Rhythm: Regular rhythm. Tachycardia present.  Pulmonary:     Effort: Pulmonary effort is normal. No respiratory distress.     Breath sounds: Wheezing (bilateral lower lung fields) and rhonchi (bilateral upper lung fields) present.  Musculoskeletal:     Cervical back: Neck supple.  Skin:    General: Skin is warm and dry.     Capillary Refill: Capillary refill takes less than 2 seconds.  Neurological:     General: No focal deficit present.     Mental Status: He is alert. Mental status is at baseline.      Motor: No weakness.     Gait: Gait normal.  Psychiatric:        Mood and Affect: Mood normal.        Behavior: Behavior normal.      UC Treatments / Results  Labs (all labs ordered are listed, but only abnormal results are displayed) Labs Reviewed - No data to display   EKG   Radiology DG Chest 2 View Result Date: 02/16/2023 CLINICAL  DATA:  cough x 2 weeks. rsv+ EXAM: CHEST - 2 VIEW COMPARISON:  Chest x-ray 12/14/2022 FINDINGS: The heart and mediastinal contours are within normal limits. No focal consolidation. No pulmonary edema. No pleural effusion. No pneumothorax. No acute osseous abnormality. IMPRESSION: No active cardiopulmonary disease. Electronically Signed   By: Tish Frederickson M.D.   On: 02/16/2023 18:36    Procedures Procedures (including critical care time)  Medications Ordered in UC Medications - No data to display  Initial Impression / Assessment and Plan / UC Course  I have reviewed the triage vital signs and the nursing notes.  Pertinent labs & imaging results that were available during my care of the patient were reviewed by me and considered in my medical decision making (see chart for details).   28 year old male presents for 8-day history of fatigue, cough, congestion and sore throat.  Tested positive for RSV 6 days ago.  Over the past few days has developed intense left-sided sinus pressure, discolored nasal drainage, headaches, worsening fatigue, wheezing and shortness of breath.  Patient is ill-appearing but nontoxic.  He is tachycardic.  No acute distress.  On exam has mild nasal congestion and erythema posterior pharynx.  Tenderness of left frontal and maxillary sinuses.  Few scattered rhonchi and wheezing.  Chest x-ray obtained to assess for possible pneumonia.  X-ray negative.  Explained to patient that he has RSV bronchitis and sinusitis.  Sinusitis is new and bronchitis is worsened.  Will cover patient at this time for possible developing bacterial  sinusitis with Augmentin.  Also sent prednisone, ProAir inhaler, Bromfed-DM and Atrovent nasal spray to pharmacy.  Supportive care encouraged with increasing rest and fluids.   Advised for him to return if fever, weakness, increased breathing problem but explained that symptoms can last for a few weeks.  Acute illness with systemic symptoms.  Final Clinical Impressions(s) / UC Diagnoses   Final diagnoses:  Acute cough  Acute sinusitis, recurrence not specified, unspecified location  Acute bronchitis, unspecified organism  Wheezing     Discharge Instructions      -You have RSV bronchitis and sinus infection.  Your sinus infection is likely also due to this virus but we can try an antibiotic to see if it would help at this time.  I also sent prednisone, and inhaler and a new cough medicine.  Use the nasal spray as well.  Consider using a Nettie pot or nasal saline rinses.  Tylenol and ibuprofen as needed.  Increase rest and fluids. - Again, symptoms related RSV can last for a few weeks. - You need to be seen again if you develop a fever or worsening breathing problem, otherwise it is can take some time for this virus to run its course.     ED Prescriptions     Medication Sig Dispense Auth. Provider   amoxicillin-clavulanate (AUGMENTIN) 875-125 MG tablet Take 1 tablet by mouth every 12 (twelve) hours for 7 days. 14 tablet Eusebio Friendly B, PA-C   predniSONE (DELTASONE) 20 MG tablet Take 2 tablets (40 mg total) by mouth daily for 5 days. 10 tablet Eusebio Friendly B, PA-C   brompheniramine-pseudoephedrine-DM 30-2-10 MG/5ML syrup Take 10 mLs by mouth 4 (four) times daily as needed for up to 7 days. 150 mL Eusebio Friendly B, PA-C   ipratropium (ATROVENT) 0.06 % nasal spray Place 2 sprays into both nostrils 4 (four) times daily. 15 mL Eusebio Friendly B, PA-C   albuterol (VENTOLIN HFA) 108 (90 Base) MCG/ACT inhaler Inhale 1-2 puffs into  the lungs every 6 (six) hours as needed for wheezing or shortness  of breath. 1 g Shirlee Latch, PA-C      PDMP not reviewed this encounter.   Shirlee Latch, PA-C 02/10/23 1954    Shirlee Latch, PA-C 02/16/23 1914

## 2023-02-16 NOTE — Discharge Instructions (Addendum)
-  You have RSV bronchitis and sinus infection.  Your sinus infection is likely also due to this virus but we can try an antibiotic to see if it would help at this time.  I also sent prednisone, and inhaler and a new cough medicine.  Use the nasal spray as well.  Consider using a Nettie pot or nasal saline rinses.  Tylenol and ibuprofen as needed.  Increase rest and fluids. - Again, symptoms related RSV can last for a few weeks. - You need to be seen again if you develop a fever or worsening breathing problem, otherwise it is can take some time for this virus to run its course.

## 2023-02-16 NOTE — ED Triage Notes (Signed)
Pt c/o cough,HA & congestion x1 wk. Tested + for RSV on 1/21, states sx's worsening.

## 2023-09-09 ENCOUNTER — Ambulatory Visit (INDEPENDENT_AMBULATORY_CARE_PROVIDER_SITE_OTHER)
Admission: RE | Admit: 2023-09-09 | Discharge: 2023-09-09 | Disposition: A | Discharge status: Home / Self Care | Source: Ambulatory Visit | Attending: Family Medicine | Admitting: Family Medicine

## 2023-09-09 ENCOUNTER — Ambulatory Visit: Payer: Self-pay | Admitting: Family Medicine

## 2023-09-09 ENCOUNTER — Encounter: Payer: Self-pay | Admitting: Family Medicine

## 2023-09-09 ENCOUNTER — Ambulatory Visit (INDEPENDENT_AMBULATORY_CARE_PROVIDER_SITE_OTHER): Admitting: Family Medicine

## 2023-09-09 VITALS — BP 120/78 | HR 102 | Temp 98.1°F | Ht 68.0 in | Wt 207.1 lb

## 2023-09-09 DIAGNOSIS — M25571 Pain in right ankle and joints of right foot: Secondary | ICD-10-CM

## 2023-09-09 DIAGNOSIS — M79671 Pain in right foot: Secondary | ICD-10-CM

## 2023-09-09 NOTE — Progress Notes (Signed)
 Patient ID: Theodore Greer, male    DOB: October 01, 1995, 28 y.o.   MRN: [id number]  This visit was conducted in person.  BP 120/78   Pulse (!) 102   Temp 98.1 F (36.7 C) (Oral)   Ht 5' 8 (1.727 m)   Wt 207 lb 2 oz (94 kg)   SpO2 97%   BMI 31.49 kg/m    CC:  Chief Complaint  Patient presents with   Ankle Injury    C/o injury to R ankle after missing a step and rolling it on 09/05/23. Also, c/o pain in anterior R foot. H/o B ankle fxs.     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 Also, c/o pain in anterior R foot. H/o B ankle fxs. )   New onset right ankle injury on 09/05/2023  Accidentally missed a step and rolled ankle, forced internal rotation.  Now with pain in anterior foot and lateral ankle   Able to weight bear, bruising in right lateral ankle and  anterior ankle and dorsal foot.  Has been going to work.  Icing and elevating, ibuprofen  400 mg several times a day.   Lateral flexion of ankle  causes pain.   Hx of  comminuted fracture distal tibia and fibula 2015      Relevant past medical, surgical, family and social history reviewed and updated as indicated. Interim medical history since our last visit reviewed. Allergies and medications reviewed and updated. Outpatient Medications Prior to Visit  Medication Sig Dispense Refill   meloxicam (MOBIC) 15 MG tablet Take 15 mg by mouth daily.     triamcinolone  cream (KENALOG ) 0.1 % Apply 1 Application topically 2 (two) times daily as needed. 30 g 1   albuterol  (VENTOLIN  HFA) 108 (90 Base) MCG/ACT inhaler Inhale 1-2 puffs into the lungs every 6 (six) hours as needed for wheezing or shortness of breath. 1 g 0   ipratropium (ATROVENT ) 0.06 % nasal spray Place 2 sprays into both nostrils 4 (four) times daily. 15 mL 0   levalbuterol  (XOPENEX  HFA) 45 MCG/ACT inhaler Inhale 1-2 puffs into the  lungs every 6 (six) hours as needed for wheezing. 45 g 0   Spacer/Aero-Holding Chambers (AEROCHAMBER MV) inhaler Use as instructed 1 each 2   No facility-administered medications prior to visit.     Per HPI unless specifically indicated in ROS section below Review of Systems  Constitutional:  Negative for fatigue and fever.  HENT:  Negative for ear pain.   Eyes:  Negative for pain.  Respiratory:  Negative for cough and shortness of breath.   Cardiovascular:  Negative for chest pain, palpitations and leg swelling.  Gastrointestinal:  Negative for abdominal pain.  Genitourinary:  Negative for dysuria.  Musculoskeletal:  Positive for arthralgias.  Neurological:  Negative for syncope, light-headedness and headaches.  Psychiatric/Behavioral:  Negative for dysphoric mood.    Objective:  BP 120/78   Pulse (!) 102   Temp 98.1 F (36.7 C) (Oral)   Ht 5' 8 (1.727 m)   Wt 207 lb 2 oz (94 kg)   SpO2 97%   BMI 31.49 kg/m   Wt Readings from Last 3 Encounters:  09/09/23 207 lb 2 oz (94 kg)  02/16/23 200 lb (90.7 kg)  11/26/22 218 lb 3.2 oz (99 kg)      Physical Exam Vitals reviewed.  Constitutional:      Appearance: He is well-developed.  HENT:     Head: Normocephalic.     Right Ear: Hearing normal.     Left Ear: Hearing normal.  Neck:     Thyroid : No thyroid  mass or thyromegaly.     Vascular: No carotid bruit.     Trachea: Trachea normal.  Cardiovascular:     Rate and Rhythm: Normal rate and regular rhythm.     Pulses: Normal pulses.     Heart sounds: Heart sounds not distant. No murmur heard.    No friction rub. No gallop.     Comments: No peripheral edema Pulmonary:     Effort: Pulmonary effort is normal. No respiratory distress.     Breath sounds: Normal breath sounds.  Musculoskeletal:     Right ankle: Swelling and ecchymosis present. Tenderness present over the ATF ligament. No lateral malleolus, medial malleolus, AITF ligament, CF ligament, base of 5th metatarsal or  proximal fibula tenderness. Decreased range of motion. Anterior drawer test negative. Normal pulse.     Right Achilles Tendon: Normal. No tenderness. Thompson's test negative.     Left ankle: Normal.     Right foot: Decreased range of motion. Swelling, tenderness and bony tenderness present.     Left foot: Normal.     Comments: Ttp over anterior dorsal foot at 2,3,4 MCP joints  Skin:    General: Skin is warm and dry.     Findings: No rash.  Psychiatric:        Speech: Speech normal.        Behavior: Behavior normal.        Thought Content: Thought content normal.       Results for orders placed or performed during the hospital encounter of 02/10/23  Resp Panel by RT-PCR (Flu A&B, Covid) Anterior Nasal Swab   Collection Time: 02/10/23  7:02 PM   Specimen: Anterior Nasal Swab  Result Value Ref Range   SARS Coronavirus 2 by RT PCR NEGATIVE NEGATIVE   Influenza A by PCR NEGATIVE NEGATIVE   Influenza B by PCR NEGATIVE NEGATIVE  Group A Strep by PCR   Collection Time: 02/10/23  7:02 PM   Specimen: Throat; Sterile Swab  Result Value Ref Range   Group A Strep by PCR NOT DETECTED NOT DETECTED    Assessment and Plan  Right foot pain -     DG Foot Complete Right  Acute right ankle pain -     DG Ankle Complete Right  Acute, will evaluate foot injury with x-rays.  At minimum patient has a first or second-degree lateral ankle sprain in the right foot ankle and bony bruising and anterior foot. Recommend ice, elevation and ibuprofen  800 mg 3 times a day as needed for pain and inflammation.  Recommended rigid brace on right ankle (patient has was).  Start home physical therapy and ankle rehabilitation, recommended progressing through this with gentle strengthening as less support needed. Encouraged patient to wear brace at work and limit weightbearing or ladder work.  He denies need for restriction note for work at this time.  Return if symptoms worsen or fail to improve.   Greig Ring,  MD
